# Patient Record
Sex: Male | Born: 1970 | Race: White | Hispanic: No | Marital: Married | Smoking: Current every day smoker
Health system: Southern US, Community
[De-identification: ages and names within clinical notes are randomized; demographics above are authoritative.]

## PROBLEM LIST (undated history)

## (undated) DIAGNOSIS — G629 Polyneuropathy, unspecified: Secondary | ICD-10-CM

## (undated) DIAGNOSIS — F1910 Other psychoactive substance abuse, uncomplicated: Secondary | ICD-10-CM

## (undated) DIAGNOSIS — F329 Major depressive disorder, single episode, unspecified: Secondary | ICD-10-CM

## (undated) DIAGNOSIS — F32A Depression, unspecified: Secondary | ICD-10-CM

## (undated) DIAGNOSIS — K219 Gastro-esophageal reflux disease without esophagitis: Secondary | ICD-10-CM

## (undated) DIAGNOSIS — F1011 Alcohol abuse, in remission: Secondary | ICD-10-CM

## (undated) DIAGNOSIS — E538 Deficiency of other specified B group vitamins: Secondary | ICD-10-CM

## (undated) DIAGNOSIS — F200 Paranoid schizophrenia: Secondary | ICD-10-CM

## (undated) HISTORY — PX: MULTIPLE TOOTH EXTRACTIONS: SHX2053

## (undated) HISTORY — PX: COLONOSCOPY: SHX174

---

## 2006-04-25 ENCOUNTER — Emergency Department (HOSPITAL_COMMUNITY): Admission: EM | Admit: 2006-04-25 | Discharge: 2006-04-26 | Payer: Self-pay | Admitting: Emergency Medicine

## 2006-04-26 ENCOUNTER — Emergency Department (HOSPITAL_COMMUNITY): Admission: EM | Admit: 2006-04-26 | Discharge: 2006-04-26 | Payer: Self-pay | Admitting: Emergency Medicine

## 2006-07-11 ENCOUNTER — Ambulatory Visit: Payer: Self-pay | Admitting: Internal Medicine

## 2006-07-17 DIAGNOSIS — E538 Deficiency of other specified B group vitamins: Secondary | ICD-10-CM | POA: Insufficient documentation

## 2006-09-20 ENCOUNTER — Ambulatory Visit: Payer: Self-pay | Admitting: Internal Medicine

## 2006-09-21 ENCOUNTER — Ambulatory Visit: Payer: Self-pay | Admitting: *Deleted

## 2006-10-10 DIAGNOSIS — F4323 Adjustment disorder with mixed anxiety and depressed mood: Secondary | ICD-10-CM | POA: Insufficient documentation

## 2006-10-10 DIAGNOSIS — G5730 Lesion of lateral popliteal nerve, unspecified lower limb: Secondary | ICD-10-CM | POA: Insufficient documentation

## 2006-10-10 DIAGNOSIS — F1010 Alcohol abuse, uncomplicated: Secondary | ICD-10-CM | POA: Insufficient documentation

## 2006-10-22 ENCOUNTER — Ambulatory Visit: Payer: Self-pay | Admitting: Internal Medicine

## 2006-11-21 ENCOUNTER — Ambulatory Visit: Payer: Self-pay | Admitting: Internal Medicine

## 2006-12-21 ENCOUNTER — Ambulatory Visit: Payer: Self-pay | Admitting: Internal Medicine

## 2006-12-21 DIAGNOSIS — M25569 Pain in unspecified knee: Secondary | ICD-10-CM | POA: Insufficient documentation

## 2006-12-22 ENCOUNTER — Encounter (INDEPENDENT_AMBULATORY_CARE_PROVIDER_SITE_OTHER): Payer: Self-pay | Admitting: Internal Medicine

## 2006-12-26 ENCOUNTER — Encounter (INDEPENDENT_AMBULATORY_CARE_PROVIDER_SITE_OTHER): Payer: Self-pay | Admitting: Internal Medicine

## 2006-12-26 LAB — CONVERTED CEMR LAB: Vitamin B-12: 163 pg/mL — ABNORMAL LOW (ref 211–911)

## 2007-01-08 ENCOUNTER — Ambulatory Visit (HOSPITAL_COMMUNITY): Admission: RE | Admit: 2007-01-08 | Discharge: 2007-01-08 | Payer: Self-pay | Admitting: Internal Medicine

## 2007-01-13 ENCOUNTER — Encounter (INDEPENDENT_AMBULATORY_CARE_PROVIDER_SITE_OTHER): Payer: Self-pay | Admitting: Internal Medicine

## 2007-01-30 ENCOUNTER — Ambulatory Visit: Payer: Self-pay | Admitting: Internal Medicine

## 2007-02-04 ENCOUNTER — Telehealth (INDEPENDENT_AMBULATORY_CARE_PROVIDER_SITE_OTHER): Payer: Self-pay | Admitting: Internal Medicine

## 2007-02-14 ENCOUNTER — Telehealth (INDEPENDENT_AMBULATORY_CARE_PROVIDER_SITE_OTHER): Payer: Self-pay | Admitting: Internal Medicine

## 2007-03-01 ENCOUNTER — Ambulatory Visit: Payer: Self-pay | Admitting: Internal Medicine

## 2007-03-07 ENCOUNTER — Ambulatory Visit: Payer: Self-pay | Admitting: Internal Medicine

## 2007-03-08 ENCOUNTER — Ambulatory Visit: Payer: Self-pay | Admitting: Internal Medicine

## 2007-03-12 LAB — CONVERTED CEMR LAB: Vitamin B-12: 621 pg/mL (ref 211–911)

## 2007-04-26 ENCOUNTER — Ambulatory Visit: Payer: Self-pay | Admitting: Nurse Practitioner

## 2007-04-26 DIAGNOSIS — H60399 Other infective otitis externa, unspecified ear: Secondary | ICD-10-CM | POA: Insufficient documentation

## 2007-05-27 ENCOUNTER — Ambulatory Visit: Payer: Self-pay | Admitting: Internal Medicine

## 2007-05-30 ENCOUNTER — Emergency Department (HOSPITAL_COMMUNITY): Admission: EM | Admit: 2007-05-30 | Discharge: 2007-05-30 | Payer: Self-pay | Admitting: Family Medicine

## 2007-05-30 ENCOUNTER — Telehealth (INDEPENDENT_AMBULATORY_CARE_PROVIDER_SITE_OTHER): Payer: Self-pay | Admitting: Internal Medicine

## 2007-06-06 ENCOUNTER — Encounter (INDEPENDENT_AMBULATORY_CARE_PROVIDER_SITE_OTHER): Payer: Self-pay | Admitting: *Deleted

## 2007-06-08 ENCOUNTER — Emergency Department (HOSPITAL_COMMUNITY): Admission: EM | Admit: 2007-06-08 | Discharge: 2007-06-08 | Payer: Self-pay | Admitting: Emergency Medicine

## 2007-06-26 ENCOUNTER — Ambulatory Visit: Payer: Self-pay | Admitting: Internal Medicine

## 2007-07-06 ENCOUNTER — Emergency Department (HOSPITAL_BASED_OUTPATIENT_CLINIC_OR_DEPARTMENT_OTHER): Admission: EM | Admit: 2007-07-06 | Discharge: 2007-07-07 | Payer: Self-pay | Admitting: Emergency Medicine

## 2007-07-09 ENCOUNTER — Encounter (INDEPENDENT_AMBULATORY_CARE_PROVIDER_SITE_OTHER): Payer: Self-pay | Admitting: *Deleted

## 2007-07-23 LAB — CONVERTED CEMR LAB: Vitamin B-12: >2000 pg/mL — ABNORMAL HIGH (ref 211–911)

## 2007-07-31 ENCOUNTER — Emergency Department (HOSPITAL_COMMUNITY): Admission: EM | Admit: 2007-07-31 | Discharge: 2007-07-31 | Payer: Self-pay | Admitting: Family Medicine

## 2007-08-01 ENCOUNTER — Ambulatory Visit: Payer: Self-pay | Admitting: Nurse Practitioner

## 2007-08-26 ENCOUNTER — Emergency Department (HOSPITAL_COMMUNITY): Admission: EM | Admit: 2007-08-26 | Discharge: 2007-08-26 | Payer: Self-pay | Admitting: Emergency Medicine

## 2007-09-02 ENCOUNTER — Ambulatory Visit: Payer: Self-pay | Admitting: Internal Medicine

## 2007-10-03 ENCOUNTER — Ambulatory Visit: Payer: Self-pay | Admitting: Nurse Practitioner

## 2007-11-04 ENCOUNTER — Ambulatory Visit: Payer: Self-pay | Admitting: Internal Medicine

## 2007-12-06 ENCOUNTER — Ambulatory Visit: Payer: Self-pay | Admitting: Internal Medicine

## 2008-01-06 ENCOUNTER — Ambulatory Visit: Payer: Self-pay | Admitting: Internal Medicine

## 2008-02-06 ENCOUNTER — Ambulatory Visit: Payer: Self-pay | Admitting: Internal Medicine

## 2008-03-09 ENCOUNTER — Ambulatory Visit: Payer: Self-pay | Admitting: Internal Medicine

## 2008-03-30 ENCOUNTER — Telehealth (INDEPENDENT_AMBULATORY_CARE_PROVIDER_SITE_OTHER): Payer: Self-pay | Admitting: Internal Medicine

## 2008-03-31 ENCOUNTER — Ambulatory Visit: Payer: Self-pay | Admitting: Internal Medicine

## 2008-04-21 ENCOUNTER — Telehealth (INDEPENDENT_AMBULATORY_CARE_PROVIDER_SITE_OTHER): Payer: Self-pay | Admitting: Internal Medicine

## 2008-04-23 ENCOUNTER — Ambulatory Visit: Payer: Self-pay | Admitting: Internal Medicine

## 2008-04-23 DIAGNOSIS — F200 Paranoid schizophrenia: Secondary | ICD-10-CM | POA: Insufficient documentation

## 2008-05-06 ENCOUNTER — Ambulatory Visit: Payer: Self-pay | Admitting: Internal Medicine

## 2008-06-12 ENCOUNTER — Ambulatory Visit: Payer: Self-pay | Admitting: Internal Medicine

## 2008-06-15 ENCOUNTER — Ambulatory Visit: Payer: Self-pay | Admitting: Nurse Practitioner

## 2008-06-15 ENCOUNTER — Encounter (INDEPENDENT_AMBULATORY_CARE_PROVIDER_SITE_OTHER): Payer: Self-pay | Admitting: Internal Medicine

## 2008-06-15 DIAGNOSIS — F17200 Nicotine dependence, unspecified, uncomplicated: Secondary | ICD-10-CM | POA: Insufficient documentation

## 2008-06-15 DIAGNOSIS — J329 Chronic sinusitis, unspecified: Secondary | ICD-10-CM | POA: Insufficient documentation

## 2008-07-09 LAB — CONVERTED CEMR LAB
Basophils Absolute: 0 10*3/uL (ref 0.0–0.1)
Basophils Relative: 0 % (ref 0–1)
Eosinophils Absolute: 0.4 10*3/uL (ref 0.0–0.7)
Eosinophils Relative: 5 % (ref 0–5)
HCT: 43.7 % (ref 39.0–52.0)
Hemoglobin: 14.8 g/dL (ref 13.0–17.0)
Lymphocytes Relative: 19 % (ref 12–46)
Lymphs Abs: 1.6 10*3/uL (ref 0.7–4.0)
MCHC: 33.9 g/dL (ref 30.0–36.0)
MCV: 96 fL (ref 78.0–100.0)
Monocytes Absolute: 0.8 10*3/uL (ref 0.1–1.0)
Monocytes Relative: 10 % (ref 3–12)
Neutro Abs: 5.4 10*3/uL (ref 1.7–7.7)
Neutrophils Relative %: 66 % (ref 43–77)
Platelets: 315 10*3/uL (ref 150–400)
RBC: 4.55 M/uL (ref 4.22–5.81)
RDW: 13.4 % (ref 11.5–15.5)
WBC: 8.2 10*3/uL (ref 4.0–10.5)

## 2009-01-15 ENCOUNTER — Encounter (INDEPENDENT_AMBULATORY_CARE_PROVIDER_SITE_OTHER): Payer: Self-pay | Admitting: Internal Medicine

## 2009-05-12 ENCOUNTER — Encounter (INDEPENDENT_AMBULATORY_CARE_PROVIDER_SITE_OTHER): Payer: Self-pay | Admitting: Internal Medicine

## 2009-05-12 ENCOUNTER — Ambulatory Visit: Payer: Self-pay | Admitting: Nurse Practitioner

## 2009-05-12 DIAGNOSIS — K029 Dental caries, unspecified: Secondary | ICD-10-CM | POA: Insufficient documentation

## 2009-05-12 LAB — CONVERTED CEMR LAB: Rapid HIV Screen: NEGATIVE

## 2009-05-14 LAB — CONVERTED CEMR LAB
HCT: 49.2 % (ref 39.0–52.0)
Hemoglobin: 16.4 g/dL (ref 13.0–17.0)
MCHC: 33.3 g/dL (ref 30.0–36.0)
MCV: 102.1 fL — ABNORMAL HIGH (ref 78.0–100.0)
Platelets: 327 10*3/uL (ref 150–400)
RBC: 4.82 M/uL (ref 4.22–5.81)
RDW: 13.1 % (ref 11.5–15.5)
Retic Ct Pct: 1.1 % (ref 0.4–3.1)
WBC: 6.6 10*3/uL (ref 4.0–10.5)

## 2009-05-18 ENCOUNTER — Encounter (INDEPENDENT_AMBULATORY_CARE_PROVIDER_SITE_OTHER): Payer: Self-pay | Admitting: Internal Medicine

## 2009-05-24 ENCOUNTER — Encounter (INDEPENDENT_AMBULATORY_CARE_PROVIDER_SITE_OTHER): Payer: Self-pay | Admitting: *Deleted

## 2009-07-15 ENCOUNTER — Ambulatory Visit: Payer: Self-pay | Admitting: Internal Medicine

## 2009-07-15 DIAGNOSIS — R1013 Epigastric pain: Secondary | ICD-10-CM | POA: Insufficient documentation

## 2009-07-15 DIAGNOSIS — R109 Unspecified abdominal pain: Secondary | ICD-10-CM | POA: Insufficient documentation

## 2009-07-15 LAB — CONVERTED CEMR LAB
ALT: 10 units/L (ref 0–53)
AST: 11 units/L (ref 0–37)
Albumin: 5 g/dL (ref 3.5–5.2)
Alkaline Phosphatase: 65 units/L (ref 39–117)
Amylase: 59 units/L (ref 0–105)
BUN: 13 mg/dL (ref 6–23)
Basophils Absolute: 0 10*3/uL (ref 0.0–0.1)
Basophils Relative: 0 % (ref 0–1)
CO2: 28 meq/L (ref 19–32)
Calcium: 10 mg/dL (ref 8.4–10.5)
Chlamydia, Swab/Urine, PCR: NEGATIVE
Chloride: 103 meq/L (ref 96–112)
Creatinine, Ser: 1.09 mg/dL (ref 0.40–1.50)
Eosinophils Absolute: 0.1 10*3/uL (ref 0.0–0.7)
Eosinophils Relative: 1 % (ref 0–5)
GC Probe Amp, Urine: NEGATIVE
Glucose, Bld: 92 mg/dL (ref 70–99)
HCT: 46.8 % (ref 39.0–52.0)
Hemoglobin: 15.6 g/dL (ref 13.0–17.0)
Lipase: 10 units/L (ref 0–75)
Lymphocytes Relative: 32 % (ref 12–46)
Lymphs Abs: 2.3 10*3/uL (ref 0.7–4.0)
MCHC: 33.3 g/dL (ref 30.0–36.0)
MCV: 101.3 fL — ABNORMAL HIGH (ref 78.0–100.0)
Monocytes Absolute: 0.8 10*3/uL (ref 0.1–1.0)
Monocytes Relative: 10 % (ref 3–12)
Neutro Abs: 4.1 10*3/uL (ref 1.7–7.7)
Neutrophils Relative %: 57 % (ref 43–77)
Platelets: 293 10*3/uL (ref 150–400)
Potassium: 4.5 meq/L (ref 3.5–5.3)
RBC: 4.62 M/uL (ref 4.22–5.81)
RDW: 13.6 % (ref 11.5–15.5)
Sodium: 144 meq/L (ref 135–145)
Total Bilirubin: 1.8 mg/dL — ABNORMAL HIGH (ref 0.3–1.2)
Total Protein: 7.7 g/dL (ref 6.0–8.3)
WBC: 7.3 10*3/uL (ref 4.0–10.5)

## 2009-07-16 ENCOUNTER — Encounter (INDEPENDENT_AMBULATORY_CARE_PROVIDER_SITE_OTHER): Payer: Self-pay | Admitting: Internal Medicine

## 2009-07-29 ENCOUNTER — Telehealth (INDEPENDENT_AMBULATORY_CARE_PROVIDER_SITE_OTHER): Payer: Self-pay | Admitting: Internal Medicine

## 2009-08-03 ENCOUNTER — Emergency Department (HOSPITAL_COMMUNITY): Admission: EM | Admit: 2009-08-03 | Discharge: 2009-08-03 | Payer: Self-pay | Admitting: Emergency Medicine

## 2009-08-06 ENCOUNTER — Encounter (INDEPENDENT_AMBULATORY_CARE_PROVIDER_SITE_OTHER): Payer: Self-pay | Admitting: Internal Medicine

## 2009-08-16 ENCOUNTER — Ambulatory Visit: Payer: Self-pay | Admitting: Internal Medicine

## 2009-08-20 ENCOUNTER — Other Ambulatory Visit: Payer: Self-pay

## 2009-08-21 ENCOUNTER — Inpatient Hospital Stay (HOSPITAL_COMMUNITY): Admission: RE | Admit: 2009-08-21 | Discharge: 2009-08-31 | Payer: Self-pay | Admitting: Psychiatry

## 2009-08-21 ENCOUNTER — Ambulatory Visit: Payer: Self-pay | Admitting: Psychiatry

## 2009-09-08 ENCOUNTER — Telehealth (INDEPENDENT_AMBULATORY_CARE_PROVIDER_SITE_OTHER): Payer: Self-pay | Admitting: Internal Medicine

## 2009-09-28 ENCOUNTER — Ambulatory Visit: Payer: Self-pay | Admitting: Internal Medicine

## 2009-09-28 DIAGNOSIS — R5381 Other malaise: Secondary | ICD-10-CM | POA: Insufficient documentation

## 2009-09-28 DIAGNOSIS — R5383 Other fatigue: Secondary | ICD-10-CM

## 2009-09-28 LAB — CONVERTED CEMR LAB: Testosterone: 768.61 ng/dL (ref 350–890)

## 2009-10-26 ENCOUNTER — Encounter (INDEPENDENT_AMBULATORY_CARE_PROVIDER_SITE_OTHER): Payer: Self-pay | Admitting: Internal Medicine

## 2009-11-01 ENCOUNTER — Ambulatory Visit: Payer: Self-pay | Admitting: Internal Medicine

## 2009-12-02 ENCOUNTER — Ambulatory Visit: Payer: Self-pay | Admitting: Internal Medicine

## 2010-01-03 ENCOUNTER — Ambulatory Visit: Payer: Self-pay | Admitting: Internal Medicine

## 2010-01-03 DIAGNOSIS — R05 Cough: Secondary | ICD-10-CM

## 2010-01-03 DIAGNOSIS — K219 Gastro-esophageal reflux disease without esophagitis: Secondary | ICD-10-CM | POA: Insufficient documentation

## 2010-01-03 DIAGNOSIS — R059 Cough, unspecified: Secondary | ICD-10-CM | POA: Insufficient documentation

## 2010-01-03 LAB — CONVERTED CEMR LAB: Vitamin B-12: >2000 pg/mL — ABNORMAL HIGH (ref 211–911)

## 2010-01-19 ENCOUNTER — Encounter (INDEPENDENT_AMBULATORY_CARE_PROVIDER_SITE_OTHER): Payer: Self-pay | Admitting: Internal Medicine

## 2010-02-03 ENCOUNTER — Ambulatory Visit
Admission: RE | Admit: 2010-02-03 | Discharge: 2010-02-03 | Payer: Self-pay | Source: Home / Self Care | Attending: Internal Medicine | Admitting: Internal Medicine

## 2010-03-01 NOTE — Miscellaneous (Signed)
Summary: AUTHORIZATION TO RELEASE INFO TO JENNIFER PREUSS  AUTHORIZATION TO RELEASE INFO TO JENNIFER PREUSS   Imported By: Arta Bruce 07/15/2009 12:22:05  _____________________________________________________________________  External Attachment:    Type:   Image     Comment:   External Document

## 2010-03-01 NOTE — Assessment & Plan Note (Signed)
Summary: b 12 injection/fasting labs also//gk  Nurse Visit     Allergies: No Known Drug Allergies     Medication Administration  Injection # 1:    Medication: Vit B12 1000 mcg    Diagnosis: VITAMIN B12 DEFICIENCY (ICD-266.2)    Route: IM    Site: L deltoid    Exp Date: 10/30/2009    Lot #: [id number]    Mfr: American Regent    Comments: [phone]    Patient tolerated injection without complications    Given by: Vesta Mixer CMA (May 06, 2008 11:31 AM)  Orders Added: 1)  Est. Patient Nurse visit [09003] 2)  Vit B12 1000 mcg [J3420] 3)  Admin of Therapeutic Inj  intramuscular or subcutaneous Lepidus.Putnam    ]  Medication Administration  Injection # 1:    Medication: Vit B12 1000 mcg    Diagnosis: VITAMIN B12 DEFICIENCY (ICD-266.2)    Route: IM    Site: L deltoid    Exp Date: 10/30/2009    Lot #: [id number]    Mfr: American Regent    Comments: [phone]    Patient tolerated injection without complications    Given by: Vesta Mixer CMA (May 06, 2008 11:31 AM)  Orders Added: 1)  Est. Patient Nurse visit [09003] 2)  Vit B12 1000 mcg [J3420] 3)  Admin of Therapeutic Inj  intramuscular or subcutaneous [84132]

## 2010-03-01 NOTE — Assessment & Plan Note (Signed)
Summary: kidney issues /tmm   Vital Signs:  Patient profile:   40 year old male Weight:      136 pounds Temp:     97.5 degrees F Pulse rate:   87 / minute Pulse rhythm:   regular Resp:     18 per minute BP sitting:   115 / 82  (left arm) Cuff size:   regular  Vitals Entered By: Vesta Mixer CMA (July 15, 2009 10:00 AM) CC: Sides hurting and c/o pressure when he urinates, leg hurting and wants dental referral Is Patient Diabetic? No  Does patient need assistance? Ambulation Normal   Primary Care Provider:  Ayansh Feutz  CC:  Sides hurting and c/o pressure when he urinates and leg hurting and wants dental referral.  History of Present Illness: 1.  Dental complaints:  pt. has been to dentist and waiting to be seen by oral surgeon to have extractions in near future.  Had some pain improvement with Amoxicillin previously, but had epigastric burning on Amoxicillin as well.  2.  B12 deficiency--nutritional, but difficulty getting level up even with IM injection of B12:  Has not been following up for shots.  Pt's MCV up again.  Did not get level at last visit.  3.  Pain in low thoracic back/flanks for past 2 weeks.  States was using Tylenol PM a lot.  Describes discomfort as a pressure pain, like someone pressing hard against back, also a burning discomfort that can radiate around to front of rib cage bilaterally.  Hurts worse with lying on back.  No injury to back recently.  No change with urination.  Burning may be better with eating.  Having a BM may make the burning a bit worse in rib area.  No hematochezia.  May have had melena--sounds like dark stools, may have a particularly bad odor, not sure if sticky.  No hematuria.  Does have urinary frequency and feels like he is unable to fully evacuate urine.  Also with urinary hesitancy.  Some suprapubic discomfort when needs to urinate.  May have some perineal discomfort.  Has had some testicular disomfort bilaterally in last few weeks.  No  swelling of testicles.  No penile discharge.  Allergies (verified): No Known Drug Allergies  Physical Exam  General:  NAD--very difficult historian.  Bradykinesia with slow response time Lungs:  Normal respiratory effort, chest expands symmetrically. Lungs are clear to auscultation, no crackles or wheezes. Heart:  Normal rate and regular rhythm. S1 and S2 normal without gallop, murmur, click, rub or other extra sounds. Abdomen:  No obvious discomfort with exam, but pt. does states epigastrium and suprapubic area a bit more uncomfortable with palpation.soft, normal bowel sounds, no masses, no guarding, no rigidity, no hepatomegaly, and no splenomegaly.  No definitie CV A tenderness. Rectal:  No external abnormalities noted. Normal sphincter tone. No rectal masses .  One small area of heme positivity on guaiac testing. Genitalia:  Testes bilaterally descended without nodularity, tenderness or masses. No scrotal masses or lesions. No penis lesions or urethral discharge.circumcised.   Prostate:  A bit enlarged, maybe a bit boggy.  Cannot get from pt. whether really tender or not.   Impression & Recommendations:  Problem # 1:  SUPRAPUBIC PAIN (ICD-789.09) Sounds like pt with chronic prostatis vs. BPH. Start with treatment of the former.  Orders: T-Culture, Urine ([id number]) T-Urinalysis ([id number]) T-Comprehensive Metabolic Panel (96295-28413) UA Dipstick w/o Micro (manual) (24401) T-GC Probe, urine ([id number]) T-Chlamydia  Probe, urine ([id number])  Problem # 2:  ABDOMINAL PAIN, EPIGASTRIC (ICD-789.06) Start Famotidine. Not clear what is causing this--do not believe at this point related to urinary tract Orders: T-CBC w/Diff [phone]) T-Amylase [phone]) T-Lipase (25427-06237) T-Comprehensive Metabolic Panel (62831-51761) UA Dipstick w/o Micro (manual) (60737)  Problem # 3:  DENTAL CARIES (ICD-521.00) To follow up with dentist  Problem # 4:  VITAMIN B12  DEFICIENCY (ICD-266.2) shot today and monthly Victorino Dike, girlfriend, to help keep him up to date with this.  Complete Medication List: 1)  Cyanocobalamin 1000 Mcg/ml Inj Soln (Cyanocobalamin) .Marland Kitchen.. 1000 mg im monthly 2)  Thiamine Hcl 100 Mg Tabs (Thiamine hcl) .Marland Kitchen.. 1 tab by mouth daily 3)  Wellbutrin Sr 150 Mg Xr12h-tab (Bupropion hcl) .... One tablet by mouth daily 4)  Ciprofloxacin Hcl 500 Mg Tabs (Ciprofloxacin hcl) .Marland Kitchen.. 1 tab by mouth two times a day for 30 days 5)  Famotidine 40 Mg Tabs (Famotidine) .Marland Kitchen.. 1 tab by mouth daily at bedtime  Patient Instructions: 1)  Follow up with Dr. Delrae Alfred in 2 months --urinary frequency 2)  Follow up with nurse visit in 1 month for B12 shot. 3)  Drink lots of fluids Prescriptions: FAMOTIDINE 40 MG TABS (FAMOTIDINE) 1 tab by mouth daily at bedtime  #30 x 4   Entered and Authorized by:   Julieanne Manson MD   Signed by:   Julieanne Manson MD on 07/15/2009   Method used:   Faxed to ...       [facility] (retail)       [address] [place], [place]  [address]       Ph: [phone]       Fax: [phone]   RxID:   [id number] CIPROFLOXACIN HCL 500 MG TABS (CIPROFLOXACIN HCL) 1 tab by mouth two times a day for 30 days  #60 x 0   Entered and Authorized by:   Julieanne Manson MD   Signed by:   Julieanne Manson MD on 07/15/2009   Method used:   Faxed to ...       [facility] (retail)       [address] [place], [place]  [address]       Ph: [phone]       Fax: [phone]   RxID:   [id number]   Appended Document: kidney issues /tmm  Laboratory Results   Urine Tests    Routine Urinalysis   Glucose: negative   (Normal Range: Negative) Bilirubin: small   (Normal Range: Negative) Ketone: trace (5)   (Normal Range: Negative) Spec. Gravity: 1.025   (Normal Range: 1.003-1.035) Blood: trace-intact   (Normal Range: Negative) pH:  6.0   (Normal Range: 5.0-8.0) Protein: 30   (Normal Range: Negative) Urobilinogen: 1.0   (Normal Range: 0-1) Nitrite: negative   (Normal Range: Negative) Leukocyte Esterace: negative   (Normal Range: Negative)         Medication Administration  Injection # 1:    Medication: Vit B12 1000 mcg    Diagnosis: VITAMIN B12 DEFICIENCY (ICD-266.2)    Route: IM    Site: R deltoid    Exp Date: 10/30/2010    Lot #: [id number]    Mfr: American Regent    Comments: [phone]    Patient tolerated injection without complications    Given by: Vesta Mixer CMA (July 15, 2009 12:02 PM)  Orders Added: 1)  Vit B12 1000 mcg [J3420] 2)  Admin of Therapeutic  Inj  intramuscular or subcutaneous [96372]   Appended Document: kidney issues /tmm Pt. admitted during visit that he has had difficulties with crack cocaine abuse in past 2 years.   He still drinks alcohol, but now that is no longer his drug of choice. His Haldol is on hold until he follows up with [facility]--secondary to his cocaine use.  He apparently has to show that he will stay off cocaine for 2 months before they will prescribe the Haldol again (as per pt. and girlfriend and mother of their newborn, Victorino Dike) He is also getting court orders to follow up with psych and is on probation for possession of drug paraphernalia.   Has not used cocaine since 4 days ago.   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

## 2010-03-01 NOTE — Assessment & Plan Note (Signed)
Summary: FU B-12/FEET AND LEGS HURTING///KT   Vital Signs:  Patient Profile:   40 Years Old Male Weight:      148 pounds Temp:     96.7 degrees F Pulse rate:   84 / minute Pulse rhythm:   regular Resp:     18 per minute BP sitting:   110 / 70  (left arm) Cuff size:   regular  Pt. in pain?   yes    Location:   feet/knees    Intensity:   6  Vitals Entered By: Vesta Mixer CMA (December 21, 2006 2:06 PM)              Is Patient Diabetic? No  Does patient need assistance? Ambulation Normal Comments out of vitamins     Chief Complaint:  feet and knees are still bothering him.  History of Present Illness: 1.  Vitamin B12 deficiency:  receiving B12 monthly IM to make sure pt. is remaining compliant--he has been coming in regularly for those since starting in June.  Unclear if B12 replacement has decreased peripheral neuropathic pain in feet and ankles.    2.  Alcoholism:  Still drinking--generally 3-6 beers daily.  Ran out of MV with 100 mg Thiamine.  3.  Bilateral knee pain--states this is chronic.  Will also have pain in feet as well.  Knee pain described as aching.  No hx of injury to knees.  Knees are occasionally swollen.  No definite erythema.  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  Has tried Ibuprofen 600 mg once daily with some relief.  4.  Depression:  Followed at [facility].  He thinks he is being followed by Dr. Hortencia Pilar.  Pt. also with psychosis and on Haldol and Cogentin--unable to say if diagnosed with Schizophrenia.  Taking Bupropion 100 mg daily  Current Allergies: No known allergies     Risk Factors:  Tobacco use:  current    Cigarettes:  Yes -- 2 pack(s) per day    Physical Exam  General:     Pt. very bradykinetic with movement and with speech.  Long delay in answering questions. Extremities:     No obvious effusions of either knee--full ROM no tenderness of joint lines or ligamentous laxity.  No  definite crepitation.  Pt. does have varicosities of left lower leg.  No edema of legs or ankles.    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 Try Neurontin as pt. seems to be bothered by discomfort at bedime. Follow up in 2 months.  Problem # 2:  KNEE PAIN, BILATERAL (ICD-719.46) Not clear about cause--possibly OA--though no changes on exam to support. Orders: Diagnostic X-Ray/Fluoroscopy (Diagnostic X-Ray/Flu)  His updated medication list for this problem includes:    Ibuprofen 600 Mg Tabs (Ibuprofen) .Marland Kitchen... 1 tab by mouth three times a day with  food.   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 1 tab by mouth daily   Complete Medication List: 1)  Cyanocobalamin 1000  Mcg/ml Inj Soln (Cyanocobalamin) .Marland Kitchen.. 1000 mg im monthly 2)  Haloperidol 1 Mg Tabs (Haloperidol) .Marland Kitchen.. 1-2 tabs by mouth at bedtime. 3)  Bupropion Hcl 100 Mg Tabs (Bupropion hcl) .Marland Kitchen.. 1 tab by mouth daily 4)  Benztropine Mesylate 1 Mg Tabs (Benztropine mesylate) .... 1/2 to 1 tab by mouth qhs 5)  Thiamine Hcl 100 Mg Tabs (Thiamine hcl) .Marland Kitchen.. 1 tab by mouth daily 6)  Neurontin 300 Mg Caps (Gabapentin) .Marland Kitchen.. 1 cap by mouth at bedtime for 3 nights, then 2 caps by mouth at bedtime. 7)  Ibuprofen 600 Mg Tabs (Ibuprofen) .Marland Kitchen.. 1 tab by mouth three times a day with  food.   Patient Instructions: 1)  Take Ibuprofen 1 tab 3 times daily with meals for knee pain 2)  The Neurontin is for the numbness and  tingling in legs. 3)  Get the xray of your knee as soon as possible at [facility] or [facility]. 4)  Call for follow up with Dr. Jiles Harold in 2 months. 5)  Stop drinking alcohol.    Prescriptions: IBUPROFEN 600 MG  TABS (IBUPROFEN) 1 tab by mouth three times a day with  food.  #90 x 1   Entered and Authorized by:   Julieanne Manson MD   Signed by:   Julieanne Manson MD on 12/21/2006   Method used:   Print then Give to Patient   RxID:   [id number] NEURONTIN 300 MG  CAPS (GABAPENTIN) 1 cap by mouth at bedtime for 3 nights, then 2 caps by mouth at bedtime.  #60 x 2   Entered and Authorized by:   Julieanne Manson MD   Signed by:   Julieanne Manson MD on 12/21/2006   Method used:   Print then Give to Patient   RxID:   [id number] THIAMINE HCL 100 MG  TABS (THIAMINE HCL) 1 tab by mouth daily  #30 x 11   Entered and Authorized by:   Julieanne Manson MD   Signed by:   Julieanne Manson MD on 12/21/2006   Method used:   Print then Give to Patient   RxID:   [id number]  ]  Medication Administration  Injection # 1:    Medication: Vit B12 1000 mcg    Diagnosis: VITAMIN B12 DEFICIENCY (ICD-266.2)    Route: IM    Site: R deltoid    Exp Date: 05/30/2008    Lot #: [id number]    Mfr: American Regent    Patient tolerated injection without complications    Given by: Vesta Mixer CMA (December 21, 2006 3:16 PM)  Orders Added: 1)  Diagnostic X-Ray/Fluoroscopy [Diagnostic X-Ray/Flu] 2)  T-Vitamin B12 [82607-23330] 3)  Est. Patient Level IV [01027] 4)  Admin of Therapeutic Inj  intramuscular or subcutaneous [90772] 5)  Vit B12 1000 mcg [J3420]

## 2010-03-01 NOTE — Assessment & Plan Note (Signed)
Summary: fu 2 months--urinary frequencyB-12//gk   Vital Signs:  Patient profile:   40 year old male Height:      70.5 inches (179.07 cm) Weight:      144.5 pounds (65.68 kg) Temp:     97.7 degrees F (36.50 degrees C) oral Pulse rate:   76 / minute Pulse rhythm:   regular Resp:     16 per minute BP sitting:   110 / 76  (left arm) Cuff size:   regular  Vitals Entered By: Michelle Nasuti (September 28, 2009 12:09 PM) CC: follow-up visit Pain Assessment Patient in pain? yes      Intensity: 4   Primary Care Provider:  Mulberry  CC:  follow-up visit.  History of Present Illness: 1.  Urinary frequency/prostatitis:  Grew E.coli resistant to Cipro.  Was treated with Macrobid two times a day for 30 days.  Urinary frequency is decreased,  no definite burning on urination.  Good urine stream and no hesitancy.  2.  Drug abuse:  pt. got into cocaine again.  On probation for drug use--going to [facility] 3 mornings each week.  To go in to [facility], a 30 day inpatient program on Thursday.   Girlfriend is getting support through [employer] has one on one interaction there to deal with above concerns.  3.  Schizophrenia:  pt. was almost catotonic end of July--this per girlfriend.  He was hospitalized for 1 week and placed back on Haldol.  Doing better now.  4.  Epigastric Pain: Not clear if has taken Famotidine regularly--no complaints of pain any longer.  5.  Fatigue:  girlfriend noted this previously.  Sex drive is not good as well.  She is concerned his testosterone level is low.  6.  Dental Clinic:  did have one tooth pulled--to have more pulled, but will have to put off until drug use concern addressed.  7.  Peripheral neuropathy:  Girlfriend shows me Mentax--folate--discussed she could use, but we did check folate in past and was okay.  Pt. would like to get back on Lyrica for his pain related to this.  Habits & Providers  Alcohol-Tobacco-Diet     Tobacco Status: current  Cigarette Packs/Day: 2.0  Allergies (verified): No Known Drug Allergies  Social History: Packs/Day:  2.0  Physical Exam  General:  Very bradykinetic, speech slow, yawns throughout exam Lungs:  Normal respiratory effort, chest expands symmetrically. Lungs are clear to auscultation, no crackles or wheezes. Heart:  Normal rate and regular rhythm. S1 and S2 normal without gallop, murmur, click, rub or other extra sounds. Abdomen:  Bowel sounds positive,abdomen soft and non-tender without masses, organomegaly or hernias noted.   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  Complete Medication List: 1)  Cyanocobalamin 1000 Mcg/ml Inj Soln (Cyanocobalamin) .Marland Kitchen.. 1000 mg im monthly 2)  Thiamine Hcl 100 Mg Tabs (Thiamine hcl) .Marland Kitchen.. 1 tab by mouth daily 3)  Famotidine 40 Mg Tabs (Famotidine) .Marland Kitchen.. 1 tab by mouth daily at bedtime 4)  Haloperidol 2 Mg Tabs (Haloperidol) .Marland Kitchen.. 1 tab by mouth at bedtime--[facility]--lindsey is nurse 5)  Benztropine Mesylate 1 Mg Tabs (Benztropine mesylate) .... 1/2 tab by mouth at bedtime.  [facility] 6)  Celexa 10 Mg Tabs (Citalopram hydrobromide) .Marland Kitchen.. 1 tab by mouth daily  [facility]. 7)  Lyrica 50 Mg Caps (Pregabalin) .Marland Kitchen.. 1 cap by mouth two times a day  Patient Instructions: 1)  B12 shot  monthly--needs nurse visit for this in 1 month 2)  Follow up with Dr. Delrae Alfred in 3 months --neuropathy Prescriptions: LYRICA 50 MG CAPS (PREGABALIN) 1 cap by mouth two times a day  #60 x 6   Entered and Authorized by:   Julieanne Manson MD   Signed by:   Julieanne Manson MD on 09/28/2009   Method used:   Print then Give to Patient   RxID:   [id number]

## 2010-03-01 NOTE — Letter (Signed)
Summary: MAILED REQUESTED RECORDS TO [facility]  MAILED REQUESTED RECORDS TO [facility]   Imported By: Arta Bruce 08/06/2009 12:29:07  _____________________________________________________________________  External Attachment:    Type:   Image     Comment:   External Document

## 2010-03-01 NOTE — Letter (Signed)
Summary: *HSN Results Follow up  [facility]  [address] [place], [place] [address]   Phone: [phone]  Fax: [phone]      06/06/2007   Todd Winters [address] [place], [place]  [address]   Dear  [name],                            ____S.Drinkard,FNP   ____D. Gore,FNP       ____B. McPherson,MD   ____V. Rankins,MD    __x__E. Mulberry,MD    ____N. Daphine Deutscher, FNP  ____D. Reche Dixon, MD    ____K. Philipp Deputy, MD    ____Other     This letter is to inform you that your recent test(s):  _______Pap Smear    _______Lab Test     _______X-ray    _______ is within acceptable limits  _______ requires a medication change  _______ requires a follow-up lab visit  _______ requires a follow-up visit with your Eaden Hettinger   Comments: We have attempted to call you several times, if you still need [place].      _________________________________________________________ If you have any questions, please contact our office                     Sincerely,  Vesta Mixer CMA [facility]

## 2010-03-01 NOTE — Letter (Signed)
Summary: *HSN Results Follow up  [facility]  [address] [place], [place] [address]   Phone: [phone]  Fax: [phone]      01/13/2007   ELAD MACPHAIL [address] [place], [place]  [address]   Dear  [name],                            ____S.Drinkard,FNP   ____D. Gore,FNP       ____B. McPherson,MD   ____V. Rankins,MD    __X__E. Mulberry,MD    ____N. Daphine Deutscher, FNP  ____D. Reche Dixon, MD    ____K. Philipp Deputy, MD    ____Other     This letter is to inform you that your recent test(s):  _______Pap Smear    ___X____Lab Test     ___X____X-ray  See in Comments.  _______ is within acceptable limits  _______ requires a medication change  _______ requires a follow-up lab visit  _______ requires a follow-up visit with your provider   Comments:  Your levels of B12 are still low--which is unusual since you've been getting shots.  I am talking with some other physicians about your case (no names mentioned) and will get back to you--it might be after the New Year, however.  Sorry this is taking a bit.  Also, your knee xrays were normal.  We'll see what the Neurontin does for you.       _________________________________________________________ If you have any questions, please contact our office                     Sincerely,  Julieanne Manson MD [facility]

## 2010-03-01 NOTE — Progress Notes (Signed)
Summary: refills request  Phone Note Call from Patient Call back at [phone]   Call For: [phone] Summary of Call: Pt needs to know if the proivider can refills his lyrica and  mentex.  [facility] [phone]) [facility] Initial call taken by: Manon Hilding,  July 29, 2009 3:57 PM  Follow-up for Phone Call        Fwd to Dr. Delrae Alfred for refill Berkley Harvey.  Pt uses [employer] [facility]. Follow-up by: Vesta Mixer CMA,  July 29, 2009 4:07 PM  Additional Follow-up for Phone Call Additional follow up Details #1::        Need more info--he was not taking Lyrica and we have not filled Mentex at last visit. Additional Follow-up by: Julieanne Manson MD,  July 30, 2009 6:17 PM    Additional Follow-up for Phone Call Additional follow up Details #2::    Left message on answering machine for pt to return call  Follow-up by: Vesta Mixer CMA,  August 11, 2009 4:50 PM  Additional Follow-up for Phone Call Additional follow up Details #3:: Details for Additional Follow-up Action Taken: pt says he took lyrica pt says its been a year since he recieved a script......Marland Kitchen pt says he is not sure why he stopped the lyrica but has been off of it for a month now.Marland KitchenMarland KitchenMarland KitchenArmenia Shannon  August 16, 2009 3:20 PM I do not follow--the last time the Rx for Roselee Nova was written was 03/2008 and he should have run out 6 months later.  If he is interested in restarting, needs to make an OV to discuss--was never clear that it helped him.  Also--what about the Mentex he was asking about as well?  Julieanne Manson MD  August 22, 2009 5:12 PM   Pt will come in on 8/16 to address.......... Tiffany McCoy CMA  August 25, 2009 11:05 AM

## 2010-03-01 NOTE — Medication Information (Signed)
Summary: PRIOR AUTHORIZATION /APPROVED  PRIOR AUTHORIZATION /APPROVED   Imported By: Arta Bruce 04/23/2008 10:24:41  _____________________________________________________________________  External Attachment:    Type:   Image     Comment:   External Document

## 2010-03-01 NOTE — Progress Notes (Signed)
Summary: refill request  Phone Note Call from Patient Call back at Home Phone [phone]   Caller: Patient Call For: [phone] Summary of Call: The pt states that he needs more medical refills from Lyrica and Neuratin medications.  [facility] [address] Dr Delrae Alfred Initial call taken by: Manon Hilding,  March 30, 2008 2:28 PM  Follow-up for Phone Call        note pt changing pharmacy I see lyrica on list, but not nuerontin. Follow-up by: Vesta Mixer CMA,  March 31, 2008 11:07 AM      Prescriptions: LYRICA 50 MG CAPS (PREGABALIN) 1 cap by mouth two times a day  #60 x 6   Entered and Authorized by:   Julieanne Manson MD   Signed by:   Julieanne Manson MD on 03/31/2008   Method used:   Printed then faxed to ...       [facility] (retail)       [address] [place], [place]  [address]       Ph: [phone]       Fax: [phone]   RxID:   [id number]  [facility] [address]

## 2010-03-01 NOTE — Progress Notes (Signed)
Summary: Lyrica Prescription  Phone Note Call from Patient Call back at Home Phone [phone] Call back at [phone]   Caller: Patient Summary of Call: This pt recently has medicaid but he doesn't know if needs to receive an authorization from the [facility] or either from medicaid to get his lyrica.  The medication cost him $180.00 and he cannot afford to pay the medication.  [facility] is waiting for an authorization  Dr Delrae Alfred  Initial call taken by: Manon Hilding,  April 21, 2008 12:45 PM  Follow-up for Phone Call        PA form filled out and in your office. Follow-up by: Vesta Mixer CMA,  April 22, 2008 9:21 AM  Additional Follow-up for Phone Call Additional follow up Details #1::        Already approved. Additional Follow-up by: Julieanne Manson MD,  April 22, 2008 5:51 PM

## 2010-03-01 NOTE — Assessment & Plan Note (Signed)
Summary: B-12///KT  Nurse Visit    Prior Medications: CYANOCOBALAMIN 1000 MCG/ML INJ SOLN (CYANOCOBALAMIN) 1000 mg IM monthly HALOPERIDOL 1 MG  TABS (HALOPERIDOL) 1-2 tabs by mouth at bedtime. BUPROPION HCL 100 MG  TABS (BUPROPION HCL) 1 tab by mouth daily BENZTROPINE MESYLATE 1 MG  TABS (BENZTROPINE MESYLATE) 1/2 to 1 tab by mouth qhs THIAMINE HCL 100 MG  TABS (THIAMINE HCL) 1 tab by mouth daily NEURONTIN 300 MG  CAPS (GABAPENTIN) 1 cap by mouth at bedtime for 3 nights, then 2 caps by mouth at bedtime. IBUPROFEN 600 MG  TABS (IBUPROFEN) 1 tab by mouth three times a day with  food. Current Allergies: No known allergies     Medication Administration  Injection # 1:    Medication: Vit B12 1000 mcg    Diagnosis: VITAMIN B12 DEFICIENCY (ICD-266.2)    Route: IM    Site: L deltoid    Exp Date: 03/30/2009    Lot #: [id number]    Mfr: American Regent    Comments: [phone]    Patient tolerated injection without complications    Given by: Vesta Mixer CMA (November 04, 2007 4:05 PM)  Orders Added: 1)  Est. Patient Nurse visit [09003] 2)  Vit B12 1000 mcg [J3420] 3)  Admin of Therapeutic Inj  intramuscular or subcutaneous Lepidus.Putnam    ]

## 2010-03-01 NOTE — Progress Notes (Signed)
Summary: ov  Phone Note Call from Patient   Summary of Call: MULBERRY PT. HAVING HEADACHES AND FEELING REAL WEAK SINCE CHRISTMAS/ AND LOWER BACK PAIN ON RIGHT AND LEFT SIDE. AND HE WANTS TO BE CHECKED FOR STD BECAUSE HE HAS SOME RED SPOTS IN HIS PRIVATE AREA. Initial call taken by: Leodis Rains,  February 04, 2007 4:01 PM  Follow-up for Phone Call        see if pt can come in this thur at 8:45 with mulberry Follow-up by: Vesta Mixer CMA,  February 04, 2007 4:20 PM  Additional Follow-up for Phone Call Additional follow up Details #1::        spoke with    Additional Follow-up for Phone Call Additional follow up Details #2::    IS SCHEDULED TO COME IN ON THURSDAY 01/08 TO SEE MULBERRY Follow-up by: Leodis Rains,  February 05, 2007 9:13 AM

## 2010-03-01 NOTE — Assessment & Plan Note (Signed)
Summary: B12 PER NURSE / NS  Nurse Visit    Prior Medications: CYANOCOBALAMIN 1000 MCG/ML INJ SOLN (CYANOCOBALAMIN) 1000 mg IM monthly HALOPERIDOL 1 MG  TABS (HALOPERIDOL) 1-2 tabs by mouth at bedtime. BUPROPION HCL 100 MG  TABS (BUPROPION HCL) 1 tab by mouth daily BENZTROPINE MESYLATE 1 MG  TABS (BENZTROPINE MESYLATE) 1/2 to 1 tab by mouth qhs THIAMINE HCL 100 MG  TABS (THIAMINE HCL) 1 tab by mouth daily NEURONTIN 300 MG  CAPS (GABAPENTIN) 1 cap by mouth at bedtime for 3 nights, then 2 caps by mouth at bedtime. IBUPROFEN 600 MG  TABS (IBUPROFEN) 1 tab by mouth three times a day with  food. Current Allergies: No known allergies     Medication Administration  Injection # 1:    Medication: Vit B12 1000 mcg    Diagnosis: VITAMIN B12 DEFICIENCY (ICD-266.2)    Route: IM    Site: R deltoid    Lot #: [id number]    Mfr: American Regent    Patient tolerated injection without complications    Given by: Vesta Mixer CMA (October 14, 2007 10:18 AM)  Orders Added: 1)  Est. Patient Nurse visit [09003] 2)  Vit B12 1000 mcg [J3420] 3)  Admin of Therapeutic Inj  intramuscular or subcutaneous Lepidus.Putnam    ]

## 2010-03-01 NOTE — Assessment & Plan Note (Signed)
Summary: per Dr. Delrae Alfred B12 injection//gk  Nurse Visit    Prior Medications: CYANOCOBALAMIN 1000 MCG/ML INJ SOLN (CYANOCOBALAMIN) 1000 mg IM monthly HALOPERIDOL 1 MG  TABS (HALOPERIDOL) 1-2 tabs by mouth at bedtime. BUPROPION HCL 100 MG  TABS (BUPROPION HCL) 1 tab by mouth daily BENZTROPINE MESYLATE 1 MG  TABS (BENZTROPINE MESYLATE) 1/2 to 1 tab by mouth qhs THIAMINE HCL 100 MG  TABS (THIAMINE HCL) 1 tab by mouth daily NEURONTIN 300 MG  CAPS (GABAPENTIN) 1 cap by mouth at bedtime for 3 nights, then 2 caps by mouth at bedtime. IBUPROFEN 600 MG  TABS (IBUPROFEN) 1 tab by mouth three times a day with  food. Current Allergies: No known allergies     Medication Administration  Injection # 1:    Medication: Vit B12 1000 mcg    Diagnosis: VITAMIN B12 DEFICIENCY (ICD-266.2)    Route: IM    Site: L deltoid    Exp Date: 05/2008    Lot #: [id number]    Mfr: American Regent    Patient tolerated injection without complications    Given by: Verdis Prime (March 07, 2007 2:42 PM)  Orders Added: 1)  Vit B12 1000 mcg [J3420] 2)  Admin of Therapeutic Inj  intramuscular or subcutaneous Lepidus.Putnam    ]

## 2010-03-01 NOTE — Letter (Signed)
Summary: *HSN Results Follow up  [facility]  [address] [place], [place] [address]   Phone: [phone]  Fax: [phone]      10/26/2009   [facility] [address] [place], [place]  [address]   Dear  [name],                            ____S.Drinkard,FNP   ____D. Gore,FNP       ____B. McPherson,MD   ____V. Rankins,MD    _X___E. Mulberry,MD    ____N. Daphine Deutscher, FNP  ____D. Reche Dixon, MD    ____K. Philipp Deputy, MD    ____Other     This letter is to inform you that your recent test(s):  _______Pap Smear    ____X___Lab Test     _______X-ray    ___X____ is within acceptable limits  _______ requires a medication change  _______ requires a follow-up lab visit  _______ requires a follow-up visit with your Yeraldin Litzenberger   Comments:  Testosterone level was normal.       _________________________________________________________ If you have any questions, please contact our office                     Sincerely,  Julieanne Manson MD [facility]

## 2010-03-01 NOTE — Progress Notes (Signed)
Summary: Dr. Darin Engels phone consult  Phone Note Outgoing Call Call back at hematology   Call placed by: Julieanne Manson MD,  February 14, 2007 10:34 AM Summary of Call: Spoke with Drs. Sherrill and Granfortuna re:  Pt's continued low B12 level and elevated MMA despite IM injections of B12 since June.  They are also uncertain of why his level would be low.  Dr. Cyndie Chime recommended checking a B12 level the day following an injection along with a homocysteine level.  The former, to see if the B12 is being absorbed, the latter to lead toward work up of MHTFR gene abnormality.  Pt. to continue with B12 injections monthly.  Please set pt. up for this with next monthly injection.  Has he come in the past 2 months for the injection? Initial call taken by: Julieanne Manson MD,  February 14, 2007 10:37 AM  Follow-up for Phone Call        pt came in on 11/21 and 12/31 for vit b12 Follow-up by: Vesta Mixer CMA,  February 15, 2007 4:54 PM  Additional Follow-up for Phone Call Additional follow up Details #1::        Thanks for info--just needs set up for this month and labs as noted. Additional Follow-up by: Julieanne Manson MD,  February 17, 2007 12:43 PM    Additional Follow-up for Phone Call Additional follow up Details #2::    pt scheduled for 03/01/07 Follow-up by: Vesta Mixer CMA,  February 21, 2007 11:23 AM

## 2010-03-01 NOTE — Assessment & Plan Note (Signed)
Summary: 3 MONTH F/U/B-12///BC   Vital Signs:  Patient profile:   40 year old male Weight:      159.31 pounds BMI:     22.62 Temp:     97.5 degrees F oral Pulse rate:   110 / minute Pulse rhythm:   regular Resp:     26 per minute BP sitting:   122 / 78  (left arm) Cuff size:   regular  Vitals Entered By: Hale Drone CMA (January 03, 2010 11:18 AM) CC: 3 month f/u on neuropathy, B-12. Would like to change his acid reflux med. (femotidne). Couging up gray fleghm x3/4 months. Ankles feel numb and knees are aching.  Is Patient Diabetic? No Pain Assessment Patient in pain? no       Does patient need assistance? Functional Status Self care Ambulation Normal   Primary Care Provider:  Mulberry  CC:  3 month f/u on neuropathy and B-12. Would like to change his acid reflux med. (femotidne). Couging up gray fleghm x3/4 months. Ankles feel numb and knees are aching. Marland Kitchen  History of Present Illness: 1.  Peripheral Neuropathy:  Lyrica helping.  Only taking once daily as it makes him sleepy.  Having difficulties waking up in morning as he is taking Haldol and Lyrica at bedtime.  Gets sleepy about 30 minutes after taking Lyrica.  Does not miss evening dose frequently.  2.  Schizophrenia:  Celexa switched to Wellbutin.  Was not using Benztropine and no signs or symptoms of TD, so discontinued.  Doing well since on Haldol.    3.  B12 deficiency:  has been coming in fairly regularly each month for shots.    4.  Coughing since before last visit in August.  Smoking 2 ppd.  Cough worse right after smoking.    5.  GERD:  Famotidine 40 mg daily.Smoking.  Drinking lots of caffeinated soda daily--at least 6 if not many more than that.  Current Medications (verified): 1)  Cyanocobalamin 1000 Mcg/ml Inj Soln (Cyanocobalamin) .Marland Kitchen.. 1000 Mg Im Monthly 2)  Thiamine Hcl 100 Mg  Tabs (Thiamine Hcl) .Marland Kitchen.. 1 Tab By Mouth Daily 3)  Famotidine 40 Mg Tabs (Famotidine) .Marland Kitchen.. 1 Tab By Mouth Daily At Bedtime 4)   Haloperidol 2 Mg Tabs (Haloperidol) .Marland Kitchen.. 1 Tab By Mouth At Bedtime--[facility]--Lindsey Is Nurse 5)  Benztropine Mesylate 1 Mg Tabs (Benztropine Mesylate) .... 1/2 Tab By Mouth At Bedtime.  [facility] 6)  Celexa 10 Mg Tabs (Citalopram Hydrobromide) .Marland Kitchen.. 1 Tab By Mouth Daily  [facility]. 7)  Lyrica 50 Mg Caps (Pregabalin) .Marland Kitchen.. 1 Cap By Mouth Two Times A Day 8)  Wellbutrin Xl 150 Mg Xr24h-Tab (Bupropion Hcl) .... Take 2 Tablets By Mouth Daily in The A.m.  Allergies (verified): No Known Drug Allergies  Physical Exam  General:  NAD--no cough during visit Ears:  External ear exam shows no significant lesions or deformities.  Otoscopic examination reveals clear canals, tympanic membranes are intact bilaterally without bulging, retraction, inflammation or discharge. Hearing is grossly normal bilaterally. Mouth:  pharynx pink and moist.  Poor dentition Neck:  No deformities, masses, or tenderness noted. Lungs:  Normal respiratory effort, chest expands symmetrically. Lungs are clear to auscultation, no crackles or wheezes. Heart:  Normal rate and regular rhythm. S1 and S2 normal without gallop, murmur, click, rub or other extra sounds.   Impression & Recommendations:  Problem # 1:  GERD (ICD-530.81) Increase to two times a day  Stop caffeine, tobacco, alcohol, elevate HOB--all discussed  His updated medication list for this problem includes:    Famotidine 40 Mg Tabs (Famotidine) .Marland Kitchen... 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  Problem # 4:  COUGH (ICD-786.2) Encouraged smoking cessation with nicotine patches.  Complete Medication  List: 1)  Cyanocobalamin 1000 Mcg/ml Inj Soln (Cyanocobalamin) .Marland Kitchen.. 1000 mg im monthly 2)  Thiamine Hcl 100 Mg Tabs (Thiamine hcl) .Marland Kitchen.. 1 tab by mouth daily 3)  Famotidine 40 Mg Tabs (Famotidine) .Marland Kitchen.. 1 tab by mouth two times a day 1/2 hour before meals 4)  Haloperidol 2 Mg Tabs (Haloperidol) .Marland Kitchen.. 1 tab by mouth at bedtime--[facility]--lindsey is nurse 5)  Benztropine Mesylate 1 Mg Tabs (Benztropine mesylate) .... 1/2 tab by mouth at bedtime.  [facility] 6)  Lyrica 50 Mg Caps (Pregabalin) .Marland Kitchen.. 1 cap by mouth two times a day 7)  Wellbutrin Xl 150 Mg Xr24h-tab (Bupropion hcl) .... Take 2 tablets by mouth daily in the a.m.  Other Orders: Flu Vaccine 8yrs + (78469) Admin 1st Vaccine (62952)  Patient Instructions: 1)  Follow up with Dr. Delrae Alfred in 4 months 2)  NUrse visit for B12 shot in 1 month Prescriptions: FAMOTIDINE 40 MG TABS (FAMOTIDINE) 1 tab by mouth two times a day 1/2 hour before meals  #60 x 11   Entered and Authorized by:   Julieanne Manson MD   Signed by:   Julieanne Manson MD on 01/03/2010   Method used:   Electronically to        [facility]. [phone]* (retail)       [address]       [place], [place]  [address]       Ph: [phone]       Fax: [phone]   RxID:   [id number]    Medication Administration  Injection # 1:    Medication: Vit B12 1000 mcg    Diagnosis: VITAMIN B12 DEFICIENCY (ICD-266.2)    Route: IM    Site: R deltoid    Exp Date: 07/30/2011    Lot #: [id number]    Mfr: American Regent    Comments: NDC: [phone]    Patient tolerated injection without complications    Given by: Hale Drone CMA (January 03, 2010 11:47 AM)  Orders Added: 1)  Flu Vaccine 45yrs + [93235] 2)  Admin 1st Vaccine [57322] 3)  Admin of Therapeutic Inj  intramuscular or subcutaneous [96372] 4)  Vit B12 1000 mcg [J3420] 5)  T-Vitamin B12 [82607-23330] 6)  Est. Patient Level IV [02542]   Immunizations  Administered:  Influenza Vaccine # 1:    Vaccine Type: Fluvax 3+    Site: left deltoid    Mfr: GlaxoSmithKline    Dose: 0.5 ml    Route: IM    Given by: Hale Drone CMA    Exp. Date: 07/30/2010    Lot #: [id number]    VIS given: 08/24/09 version given January 03, 2010.  Flu Vaccine Consent Questions:    Do you have a history of severe allergic reactions to this vaccine? no    Any prior history of allergic reactions to egg  and/or gelatin? no    Do you have a sensitivity to the preservative Thimersol? no    Do you have a past history of Guillan-Barre Syndrome? no    Do you currently have an acute febrile illness? no    Have you ever had a severe reaction to latex? no    Vaccine information given and explained to patient? yes   Immunizations Administered:  Influenza Vaccine # 1:    Vaccine Type: Fluvax 3+    Site: left deltoid    Mfr: GlaxoSmithKline    Dose: 0.5 ml    Route: IM    Given by: Hale Drone CMA    Exp. Date: 07/30/2010    Lot #: [id number]    VIS given: 08/24/09 version given January 03, 2010.

## 2010-03-01 NOTE — Letter (Signed)
Summary: DENTAL REFERRAL  DENTAL REFERRAL   Imported By: Arta Bruce 05/19/2009 09:28:29  _____________________________________________________________________  External Attachment:    Type:   Image     Comment:   External Document

## 2010-03-01 NOTE — Progress Notes (Signed)
  Phone Note Call from Patient   Caller: Patient Reason for Call: Acute Illness Summary of Call: Pt has a Sinus Infection & sore throat and he wants Dr Delrae Alfred to prescribe someting or give him an appoinment . Please, call him at [phone]. Initial call taken by: Cheryll Dessert,  May 30, 2007 2:13 PM  Follow-up for Phone Call        [phone] mailbox is full and can't accept new messages Follow-up by: Vesta Mixer CMA,  May 31, 2007 1:25 PM  Additional Follow-up for Phone Call Additional follow up Details #1::        If able to get hold of him--he needs to be evaluated in office for treatment.   Additional Follow-up by: Julieanne Manson MD,  Jun 02, 2007 5:13 PM    Additional Follow-up for Phone Call Additional follow up Details #2::    Left message on answering machine for pt to return call  Follow-up by: Vesta Mixer CMA,  Jun 04, 2007 9:10 AM  Additional Follow-up for Phone Call Additional follow up Details #3:: Details for Additional Follow-up Action Taken: Left message on answering machine for pt to return call will mail letter to pt to call if he stil needs [place]. Additional Follow-up by: Vesta Mixer CMA,  Jun 06, 2007 2:36 PM

## 2010-03-01 NOTE — Assessment & Plan Note (Signed)
Summary: OV AND B-12////KT   Vital Signs:  Patient Profile:   40 Years Old Male Height:     70.5 inches Weight:      142.5 pounds BMI:     20.23 Temp:     98.2 degrees F Pulse rate:   80 / minute Pulse rhythm:   regular Resp:     18 per minute BP sitting:   124 / 76  (left arm) Cuff size:   regular  Pt. in pain?   no  Vitals Entered By: Vesta Mixer CMA (December 06, 2007 3:05 PM)                  PCP:  Delrae Alfred  Chief Complaint:  b12 shot and f/u with pcp.  History of Present Illness: 1.  Peripheral neuropathy:  Felt secondary to B12 deficiency.  Pt. no longer deficient, but continues to have some symptoms bilaterally in pretibial area.  No numbness definitively in feet.  Did not tolerate Neurontin--made him too sleepy.  2.  Alcoholism:  Still drinking.  Trying to keep it down to 2-4 beers daily.  Not really motivated to quit--many excuses.  3.  ?Schizophrenia:  Definitely has paranoia, but no definite visual hallucinations or auditory hallucinations.  Not taking Zyprexa on a daily basis--states he gets dizzy and tired at times and will stop and start.  Has missed about 7 days out of last month.  Continues with Dr. Hortencia Pilar at [facility].    Current Allergies: No known allergies       Physical Exam  Lungs:     Normal respiratory effort, chest expands symmetrically. Lungs are clear to auscultation, no crackles or wheezes. Heart:     Normal rate and regular rhythm. S1 and S2 normal without gallop, murmur, click, rub or other extra sounds. Extremities:     No clubbing, cyanosis, edema, or deformity noted with normal full range of motion of all joints.      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 Hortencia Pilar. Encouraged pt. to take his antipsychotic regularly  Complete Medication List: 1)  Cyanocobalamin 1000 Mcg/ml Inj Soln (Cyanocobalamin) .Marland Kitchen.. 1000 mg im monthly 2)  Zyprexa 7.5 Mg Tabs (Olanzapine) .Marland Kitchen.. 1 by mouth at bedtime 3)  Thiamine Hcl 100 Mg Tabs (Thiamine hcl) .Marland Kitchen.. 1 tab by mouth daily 4)  Lyrica 50 Mg Caps (Pregabalin) .Marland Kitchen.. 1 cap by mouth two times a day   Patient Instructions: 1)  CPE with Dr. Delrae Alfred next available--also to follow up on start of Lyrica   Prescriptions: THIAMINE HCL 100 MG  TABS (THIAMINE HCL) 1 tab by mouth daily  #30 x 11   Entered and Authorized by:   Julieanne Manson MD   Signed by:   Julieanne Manson MD on 12/06/2007   Method used:   Print then Give to Patient   RxID:   [id number] LYRICA 50 MG CAPS (PREGABALIN) 1 cap by mouth two times a day  #60 x 4   Entered and Authorized by:   Julieanne Manson MD   Signed by:   Julieanne Manson MD on 12/06/2007   Method used:   Print then Give to  Patient   RxID:   [id number]  ]  Medication Administration  Injection # 1:    Medication: Vit B12 1000 mcg    Diagnosis: VITAMIN B12 DEFICIENCY (ICD-266.2)    Route: IM    Site: R deltoid    Exp Date: 07/30/2009    Lot #: [id number]    Mfr: American Regent    Comments: [phone]    Patient tolerated injection without complications    Given by: Vesta Mixer CMA (December 06, 2007 3:41 PM)  Orders Added: 1)  Est. Patient Level III [46962] 2)  Vit B12 1000 mcg [J3420] 3)  Admin of Therapeutic Inj  intramuscular or subcutaneous [95284]
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
## 2010-03-01 NOTE — Assessment & Plan Note (Signed)
Summary: DISCUSS HIS B12//GET B-12////KT   Vital Signs:  Patient Profile:   40 Years Old Male Height:     70.5 inches Weight:      152 pounds BMI:     21.58 Temp:     97.7 degrees F Pulse rate:   84 / minute Pulse rhythm:   regular Resp:     18 per minute BP sitting:   118 / 72  (left arm) Cuff size:   regular  Pt. in pain?   yes    Location:   feet/knees    Intensity:   7  Vitals Entered By: Vesta Mixer CMA (March 01, 2007 2:37 PM)              Is Patient Diabetic? No  Does patient need assistance? Ambulation Normal Comments per pt on all meds except has not started neurontin yet     Chief Complaint:  B12 injection and f/u, legs hurting him alot mostly at night, and has been real tired lately drinking alot of caffiene  to stay awake.Marland Kitchen  History of Present Illness: 1.  B12 deficiency:  Discussed coming in day after next injection--unfortunately here on a Friday for shot.  Still drinking 3-6 beers a day.  Describes a poor diet.  2.  Peripheral neuropathy:  Has not started Neurontin--does have the pills.  Have not been able to normalize B12 to see if improves yet.  Current Allergies: No known allergies         Impression & Recommendations:  Problem # 1:  VITAMIN B12 DEFICIENCY (ICD-266.2) Problem with scheduling--will have pt. come back next week to get shot and B12/homocysteine level on following day.  No charge for today  Complete Medication List: 1)  Cyanocobalamin 1000 Mcg/ml Inj Soln (Cyanocobalamin) .Marland Kitchen.. 1000 mg im monthly 2)  Haloperidol 1 Mg Tabs (Haloperidol) .Marland Kitchen.. 1-2 tabs by mouth at bedtime. 3)  Bupropion Hcl 100 Mg Tabs (Bupropion hcl) .Marland Kitchen.. 1 tab by mouth daily 4)  Benztropine Mesylate 1 Mg Tabs (Benztropine mesylate) .... 1/2 to 1 tab by mouth qhs 5)  Thiamine Hcl 100 Mg Tabs (Thiamine hcl) .Marland Kitchen.. 1 tab by mouth daily 6)  Neurontin 300 Mg Caps (Gabapentin) .Marland Kitchen.. 1 cap by mouth at bedtime for 3 nights, then 2 caps by mouth at bedtime. 7)   Ibuprofen 600 Mg Tabs (Ibuprofen) .Marland Kitchen.. 1 tab by mouth three times a day with  food.   Patient Instructions: 1)  Needs to be scheduled for B12 shot next week and return the next day for Vit B12 level and homocysteine level.    ]

## 2010-03-01 NOTE — Progress Notes (Signed)
Summary: Side Effects from antibiotic  Phone Note Call from Patient Call back at [phone]   Summary of Call: The  pt is wondered if the [name] can change the antibiotic that he is using for his teeth; although the medication helped him is causing some side effeccts as diarrhea.  Hans Eden) Delrae Alfred MD  Initial call taken by: Manon Hilding,  September 08, 2009 8:38 AM  Follow-up for Phone Call        Left message on answering machine for pt. to return call.  Dutch Quint RN  September 08, 2009 8:51 AM  Having diarrhea only -- thinks it's the medication, been going on for about 4-5 days.  Currently taking Cipro.  Dutch Quint RN  September 08, 2009 9:03 AM   Additional Follow-up for Phone Call Additional follow up Details #1::        Who prescribed cipro? I only see Macrobid in chart and that was given in June.  Should be done with that Rx by now.  Additional Follow-up by: Tereso Newcomer PA-C,  September 08, 2009 2:09 PM    Additional Follow-up for Phone Call Additional follow up Details #2::    Dr. Delrae Alfred gave him cipro on 6/16 for a utn, but changed to macrobid.  He started back taking the cipro for dental infection since he still had it.  He called the dental clinic and is scheduled to have tooth pulled next week, but says they told him to call here for medication. Follow-up by: Vesta Mixer CMA,  September 09, 2009 11:18 AM  Additional Follow-up for Phone Call Additional follow up Details #3:: Details for Additional Follow-up Action Taken: He can stop the cipro.  Not exactly a med to cover for dental infection. He would need to be seen to decide if he really needs antibiotics for his teeth. Tereso Newcomer PA-C  September 09, 2009 12:20 PM  Pt scheduled to come see Dr. Delrae Alfred per Ashby Dawes. Additional Follow-up by: Vesta Mixer CMA,  September 09, 2009 4:32 PM

## 2010-03-01 NOTE — Letter (Signed)
Summary: *HSN Results Follow up  [facility]  [address] [place], [place] [address]   Phone: [phone]  Fax: [phone]      07/09/2007   Todd Winters [address] [place], [place]  [address]   Dear  [name],                            ____S.Drinkard,FNP   ____D. Gore,FNP       ____B. McPherson,MD   ____V. Rankins,MD    __x__E. Mulberry,MD    ____N. Daphine Deutscher, FNP  ____D. Reche Dixon, MD    ____K. Philipp Deputy, MD    ____Other     This letter is to inform you that your recent test(s):  _______Pap Smear    ___x____Lab Test     _______X-ray    _______ is within acceptable limits  _______ requires a medication change  _______ requires a follow-up lab visit  _______ requires a follow-up visit with your provider   Comments: Please call us regarding your recent lab test.     _________________________________________________________ If you have any questions, please contact our office                     Sincerely,  Tiffany McCoy CMA [facility]

## 2010-03-01 NOTE — Assessment & Plan Note (Signed)
Summary: Acute - Dental problems   Vital Signs:  Patient profile:   40 year old male Height:      70.5 inches Weight:      132 pounds BMI:     18.74 Temp:     97.9 degrees F oral Pulse rate:   77 / minute Pulse rhythm:   regular Resp:     18 per minute BP sitting:   125 / 83  (left arm) Cuff size:   regular  Vitals Entered By: [place] Shannon (May 12, 2009 3:45 PM) CC: pt would like a referral to go to denist... pt would like to get b12 shot....., Depression Is Patient Diabetic? No Pain Assessment Patient in pain? no       Does patient need assistance? Functional Status Self care Ambulation Normal   CC:  pt would like a referral to go to denist... pt would like to get b12 shot..... and Depression.  History of Present Illness:  Pt into the office with complaints of tooth pain. Pt has been to the dentist about 1 year go but did not go back for f/u. He admits to poor dentation over the years and long history of chewing tobacco and ongoing cigarette use. ETOH use - quit daily drinking back in October 2010 still with some ETOH use but only about once per week (very limited in disclosing information about EtOH use)  "Mother of his unborn child" in room during exam.  Vitamin B12 deficiency - Previously getting Vitamin B12 injections but he has not received any injections in over 6 months.  He admits that he did have more energy when he was taking the injections Comments:  ongoing f/u at the [facility] Cogentin and haldol d/c'd but pt will not tell why.   Habits & Providers  Alcohol-Tobacco-Diet     Alcohol drinks/day: 1     Alcohol Counseling: to decrease amount and/or frequency of alcohol intake     Tobacco Status: current     Tobacco Counseling: to quit use of tobacco products     Cigarette Packs/Day: 2 packs per day  Exercise-Depression-Behavior     Drug Use: yes  Comments: Pt goes to the [facility]  Allergies (verified): No Known Drug  Allergies  Review of Systems General:  Complains of fatigue; dental problems. CV:  Denies chest pain or discomfort. Resp:  Denies cough. GI:  Denies abdominal pain.  Physical Exam  General:  alert.   Head:  normocephalic.   Mouth:  poor dentation  MULTIPLE dental caries - left lower with several broken teeth, general discoloration Lungs:  normal breath sounds.   Heart:  normal rate and regular rhythm.   Abdomen:  normal bowel sounds.   Neurologic:  alert slow reactions and speech Psych:  Oriented X3.     Impression & Recommendations:  Problem # 1:  DENTAL CARIES (ICD-521.00) mouth is in poor condition  he will need extraction of multiple teeth Orders: T-Comprehensive Metabolic Panel ([id number]) Dental Referral (Dentist)  Problem # 2:  VITAMIN B12 DEFICIENCY (ICD-266.2) will check labs today Orders: T- * Misc. 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 1000 mg im monthly 2)  Thiamine Hcl 100 Mg Tabs (Thiamine hcl) .Marland Kitchen.. 1 tab by mouth daily 3)  Lyrica  50 Mg Caps (Pregabalin) .Marland Kitchen.. 1 cap by mouth two times a day 4)  Wellbutrin Sr 150 Mg Xr12h-tab (Bupropion hcl) .... One tablet by mouth daily 5)  Penicillin V Potassium 500 Mg Tabs (Penicillin v potassium) .... One tablet by mouth three times a day for infection 6)  Tramadol Hcl 50 Mg Tabs (Tramadol hcl) .... One tablet by mouth daily as needed for pain  Other Orders: Rapid HIV  ([id number])  Patient Instructions: 1)  You will be referred to the dental clinic.  You will be notified of the time/date of the appointment. 2)  Take the antibiotics as ordered 3)  Your vitamin B12 and other labs will be checked and notified of the  results Prescriptions: TRAMADOL HCL 50 MG TABS (TRAMADOL HCL) One tablet by mouth daily as needed for pain  #20 x 0   Entered and Authorized by:   Lehman Prom FNP   Signed by:   Lehman Prom FNP on 05/12/2009   Method used:   Print then Give to Patient   RxID:   [id number] PENICILLIN V POTASSIUM 500 MG TABS (PENICILLIN V POTASSIUM) One tablet by mouth three times a day for infection  #30` x 0   Entered and Authorized by:   Lehman Prom FNP   Signed by:   Lehman Prom FNP on 05/12/2009   Method used:   Print then Give to Patient   RxID:   [id number]    Orders Added: 1)  Est. Patient Level III [25956] 2)  T-Comprehensive Metabolic Panel [[id number]] 3)  T- * Misc. Laboratory test [99999] 4)  T-TSH [phone] 5)  Rapid HIV  [92370] 6)  Dental Referral [Dentist]  Laboratory Results  Date/Time Received: May 12, 2009 5:25 PM   Other Tests  Rapid HIV: negative

## 2010-03-01 NOTE — Letter (Signed)
Summary: *HSN Results Follow up  [facility]  [address] [place], [place] [address]   Phone: [phone]  Fax: [phone]      05/24/2009   Todd Winters [address] [place], [place]  [address]   Dear  [name],                            ____S.Drinkard,FNP   ____D. Gore,FNP       ____B. McPherson,MD   ____V. Rankins,MD    __X__E. Mulberry,MD    ____N. Daphine Deutscher, FNP  ____D. Reche Dixon, MD    ____K. Philipp Deputy, MD    ____Other     This letter is to inform you that your recent test(s):  _______Pap Smear    ___X____Lab Test     _______X-ray    _______ is within acceptable limits  _______ requires a medication change  ___X____ requires a follow-up lab visit  _______ requires a follow-up visit with your provider   Comments: Please call our office regarding a recent lab work. Thank You.       _________________________________________________________ If you have any questions, please contact our office                     Sincerely,  Vesta Mixer CMA [facility]

## 2010-03-01 NOTE — Letter (Signed)
Summary: PT REQUESTING RECORDS FOR SELF//PICKED UP  PT REQUESTING RECORDS FOR SELF//PICKED UP   Imported By: Arta Bruce 03/02/2009 12:26:43  _____________________________________________________________________  External Attachment:    Type:   Image     Comment:   External Document

## 2010-03-01 NOTE — Assessment & Plan Note (Signed)
Summary: Acute - Sinusitis   Vital Signs:  Patient profile:   40 year old male Weight:      148.5 pounds Temp:     97.3 degrees F Pulse rate:   84 / minute Pulse rhythm:   regular Resp:     20 per minute BP sitting:   112 / 80  (left arm) Cuff size:   regular  Vitals Entered By: Vesta Mixer CMA (Jun 15, 2008 2:28 PM) CC: pt c/o sinus and chest cold x 3 days has tried clairtin D, sudafed and tylenol Is Patient Diabetic? No Pain Assessment Patient in pain? yes     Location: chest/sinus Intensity: 7  Does patient need assistance? Ambulation Normal   History of Present Illness:  Pt into the office with complaint of sinus problems +cough (productive) +sinus pressure +fever +tobacco  -earpain  -sore throat +headache  Pt has been taking claritin D which has helped some. He also took some tylenol for the aches. +tobacco abuse - 2 ppd  Habits & Providers     Tobacco Status: current     Tobacco Counseling: to quit use of tobacco products     Cigarette Packs/Day: 2 packs per day     Drug Use: yes  Current Medications (verified): 1)  Cyanocobalamin 1000 Mcg/ml Inj Soln (Cyanocobalamin) .Marland Kitchen.. 1000 Mg Im Monthly 2)  Thiamine Hcl 100 Mg  Tabs (Thiamine Hcl) .Marland Kitchen.. 1 Tab By Mouth Daily 3)  Lyrica 50 Mg Caps (Pregabalin) .Marland Kitchen.. 1 Cap By Mouth Two Times A Day 4)  Haloperidol 1 Mg Tabs (Haloperidol) .Marland Kitchen.. 1 Tab By Mouth Daily 5)  Benztropine Mesylate 1 Mg Tabs (Benztropine Mesylate) .Marland Kitchen.. 1 Tab By Mouth Daily  Allergies (verified): No Known Drug Allergies  Social History:    Packs/Day:  2 packs per day  Review of Systems General:  Complains of fever. ENT:  Complains of sinus pressure; denies earache, nasal congestion, and sore throat. CV:  Complains of chest pain or discomfort; when coughing. Resp:  Complains of cough.  Physical Exam  General:  alert.  sickly Head:  normocephalic.   Ears:  bil ears with clear fluid behind TM  Nose:  sinus pressure Mouth:  fair  dentition.   Lungs:  few expiratory wheezes Heart:  normal rate and regular rhythm.   Abdomen:  normal bowel sounds.   Msk:  normal ROM.   Neurologic:  alert & oriented X3.   Skin:  color normal.   Psych:  Oriented X3.     Impression & Recommendations:  Problem # 1:  SINUSITIS (ICD-473.9) pt may continue claritin OTC His updated medication list for this problem includes:    Amoxil 500 Mg Caps (Amoxicillin) .Marland Kitchen... 1 tablet by mouth three times a day for infection  Orders: T-CBC w/Diff ([id number])  Problem # 2:  TOBACCO ABUSE (ICD-305.1) advised cessation  Complete Medication List: 1)  Cyanocobalamin 1000 Mcg/ml Inj Soln (Cyanocobalamin) .Marland Kitchen.. 1000 mg im monthly 2)  Thiamine Hcl 100 Mg Tabs (Thiamine hcl) .Marland Kitchen.. 1 tab by mouth daily 3)  Lyrica 50 Mg Caps (Pregabalin) .Marland Kitchen.. 1 cap by mouth two times a day 4)  Haloperidol 1 Mg Tabs (Haloperidol) .Marland Kitchen.. 1 tab by mouth daily 5)  Benztropine Mesylate 1 Mg Tabs (Benztropine mesylate) .Marland Kitchen.. 1 tab by mouth daily 6)  Amoxil 500 Mg Caps (Amoxicillin) .Marland Kitchen.. 1 tablet by mouth three times a day for infection  Patient Instructions: 1)  Take medications as ordered 2)  Follow up as needed if symptoms continue  or worsen. Prescriptions: AMOXIL 500 MG CAPS (AMOXICILLIN) 1 tablet by mouth three times a day for infection  #30 x 0   Entered and Authorized by:   Lehman Prom FNP   Signed by:   Lehman Prom FNP on 06/15/2008   Method used:   Print then Give to Patient   RxID:   [id number]

## 2010-03-01 NOTE — Assessment & Plan Note (Signed)
Summary: B12 INJ / NS  Nurse Visit    Prior Medications: CYANOCOBALAMIN 1000 MCG/ML INJ SOLN (CYANOCOBALAMIN) 1000 mg IM monthly HALOPERIDOL 1 MG  TABS (HALOPERIDOL) 1-2 tabs by mouth at bedtime. BUPROPION HCL 100 MG  TABS (BUPROPION HCL) 1 tab by mouth daily BENZTROPINE MESYLATE 1 MG  TABS (BENZTROPINE MESYLATE) 1/2 to 1 tab by mouth qhs THIAMINE HCL 100 MG  TABS (THIAMINE HCL) 1 tab by mouth daily NEURONTIN 300 MG  CAPS (GABAPENTIN) 1 cap by mouth at bedtime for 3 nights, then 2 caps by mouth at bedtime. IBUPROFEN 600 MG  TABS (IBUPROFEN) 1 tab by mouth three times a day with  food. Current Allergies: No known allergies     Medication Administration  Injection # 1:    Medication: Vit B12 1000 mcg    Diagnosis: VITAMIN B12 DEFICIENCY (ICD-266.2)    ]

## 2010-03-03 NOTE — Letter (Signed)
Summary: *HSN Results Follow up  [facility]  [address] [place], [place] [address]   Phone: [phone]  Fax: [phone]      01/19/2010   JEDIDIAH DEMARTINI [address] [place], [place]  [address]   Dear  [name],                            ____S.Drinkard,FNP   ____D. Gore,FNP       ____B. McPherson,MD   ____V. Rankins,MD    __X__E. Nahshon Reich,MD    ____N. Daphine Deutscher, FNP  ____D. Reche Dixon, MD    ____K. Philipp Deputy, MD    ____Other     This letter is to inform you that your recent test(s):  _______Pap Smear    ___X____Lab Test     _______X-ray    ___X____ is within acceptable limits  _______ requires a medication change  _______ requires a follow-up lab visit  _______ requires a follow-up visit with your provider   Comments:  B12 level okay--please make sure you come in monthly for shot       _________________________________________________________ If you have any questions, please contact our office                     Sincerely,  Julieanne Manson MD [facility]

## 2010-03-07 ENCOUNTER — Encounter (INDEPENDENT_AMBULATORY_CARE_PROVIDER_SITE_OTHER): Payer: Self-pay | Admitting: Internal Medicine

## 2010-04-05 ENCOUNTER — Encounter (INDEPENDENT_AMBULATORY_CARE_PROVIDER_SITE_OTHER): Payer: Self-pay | Admitting: Internal Medicine

## 2010-04-16 LAB — BASIC METABOLIC PANEL
BUN: 11 mg/dL (ref 6–23)
CO2: 33 mEq/L — ABNORMAL HIGH (ref 19–32)
Calcium: 9.2 mg/dL (ref 8.4–10.5)
Chloride: 104 mEq/L (ref 96–112)
Creatinine, Ser: 1 mg/dL (ref 0.4–1.5)
GFR calc Af Amer: >60 mL/min (ref >60)
GFR calc non Af Amer: >60 mL/min (ref >60)
Glucose, Bld: 98 mg/dL (ref 70–99)
Potassium: 4.6 mEq/L (ref 3.5–5.1)
Sodium: 141 mEq/L (ref 135–145)

## 2010-04-16 LAB — CBC
HCT: 40.3 % (ref 39.0–52.0)
Hemoglobin: 14.1 g/dL (ref 13.0–17.0)
MCH: 34.8 pg — ABNORMAL HIGH (ref 26.0–34.0)
MCHC: 34.9 g/dL (ref 30.0–36.0)
MCV: 99.6 fL (ref 78.0–100.0)
Platelets: 290 10*3/uL (ref 150–400)
RBC: 4.05 MIL/uL — ABNORMAL LOW (ref 4.22–5.81)
RDW: 14 % (ref 11.5–15.5)
WBC: 7.6 10*3/uL (ref 4.0–10.5)

## 2010-04-16 LAB — ETHANOL: Alcohol, Ethyl (B): <5 mg/dL (ref 0–10)

## 2010-04-16 LAB — DIFFERENTIAL
Basophils Absolute: 0 10*3/uL (ref 0.0–0.1)
Basophils Relative: 0 % (ref 0–1)
Eosinophils Absolute: 0.1 10*3/uL (ref 0.0–0.7)
Eosinophils Relative: 2 % (ref 0–5)
Lymphocytes Relative: 29 % (ref 12–46)
Lymphs Abs: 2.2 10*3/uL (ref 0.7–4.0)
Monocytes Absolute: 0.6 10*3/uL (ref 0.1–1.0)
Monocytes Relative: 9 % (ref 3–12)
Neutro Abs: 4.7 10*3/uL (ref 1.7–7.7)
Neutrophils Relative %: 61 % (ref 43–77)

## 2010-04-16 LAB — RAPID URINE DRUG SCREEN, HOSP PERFORMED
Amphetamines: NOT DETECTED
Barbiturates: NOT DETECTED
Benzodiazepines: NOT DETECTED
Cocaine: NOT DETECTED
Opiates: NOT DETECTED
Tetrahydrocannabinol: NOT DETECTED

## 2010-04-16 LAB — TSH: TSH: 1.007 u[IU]/mL (ref 0.350–4.500)

## 2010-04-29 ENCOUNTER — Telehealth (INDEPENDENT_AMBULATORY_CARE_PROVIDER_SITE_OTHER): Payer: Self-pay | Admitting: Internal Medicine

## 2010-05-03 NOTE — Progress Notes (Signed)
Summary: Acute appt needed  Phone Note Call from Patient Call back at Home Phone [phone]   Caller: Patient Summary of Call: Urgent appt, thinks sinus/flu, otc meds not helping Initial call taken by: Ernestine Mcmurray,  April 29, 2010 8:17 AM  Follow-up for Phone Call        Sore throat, off and on fever, feels like something stuck in back of throat, slight productive cough, yellow/green mucus, HA across forehead.  Slight nasal drainage, some congestion.  Has tried actifed, sudafed, advil.  Fever has come down, still has sore throat.  Advised he can use mucinex to loosen secretions, drink plenty of water, humidfy home, warm showers to loosen congestion, cough drops to soothe throat, gargle with salt and water several times a day to relieve itch and soreness, OTC ibuprofen or tylenol to relieve discomfort.  Also to sleep with the windows closed during high pollen season.  If develops fever, SOB or worsening symptoms, to go to ED or UC for evaluation and treatment.  Verbalized understanding and agreement.  Follow-up by: Dutch Quint RN,  April 29, 2010 3:42 PM

## 2012-05-18 ENCOUNTER — Encounter (HOSPITAL_COMMUNITY): Payer: Self-pay | Admitting: Licensed Clinical Social Worker

## 2012-05-18 ENCOUNTER — Encounter (HOSPITAL_COMMUNITY): Payer: Self-pay | Admitting: Emergency Medicine

## 2012-05-18 ENCOUNTER — Emergency Department (HOSPITAL_COMMUNITY)
Admission: EM | Admit: 2012-05-18 | Discharge: 2012-05-19 | Disposition: A | Discharge status: Other Acute Inpatient Hospital | Payer: Medicare Other | Attending: Emergency Medicine | Admitting: Emergency Medicine

## 2012-05-18 ENCOUNTER — Ambulatory Visit (HOSPITAL_COMMUNITY)
Admission: AD | Admit: 2012-05-18 | Discharge: 2012-05-18 | Disposition: A | Discharge status: Home / Self Care | Payer: Medicare Other | Attending: Psychiatry | Admitting: Psychiatry

## 2012-05-18 DIAGNOSIS — F209 Schizophrenia, unspecified: Secondary | ICD-10-CM

## 2012-05-18 DIAGNOSIS — E538 Deficiency of other specified B group vitamins: Secondary | ICD-10-CM | POA: Insufficient documentation

## 2012-05-18 DIAGNOSIS — F17200 Nicotine dependence, unspecified, uncomplicated: Secondary | ICD-10-CM | POA: Insufficient documentation

## 2012-05-18 DIAGNOSIS — Z79899 Other long term (current) drug therapy: Secondary | ICD-10-CM | POA: Insufficient documentation

## 2012-05-18 DIAGNOSIS — Z8669 Personal history of other diseases of the nervous system and sense organs: Secondary | ICD-10-CM | POA: Insufficient documentation

## 2012-05-18 DIAGNOSIS — F200 Paranoid schizophrenia: Secondary | ICD-10-CM | POA: Insufficient documentation

## 2012-05-18 DIAGNOSIS — F3289 Other specified depressive episodes: Secondary | ICD-10-CM | POA: Insufficient documentation

## 2012-05-18 DIAGNOSIS — N429 Disorder of prostate, unspecified: Secondary | ICD-10-CM | POA: Insufficient documentation

## 2012-05-18 DIAGNOSIS — R4789 Other speech disturbances: Secondary | ICD-10-CM | POA: Insufficient documentation

## 2012-05-18 DIAGNOSIS — F329 Major depressive disorder, single episode, unspecified: Secondary | ICD-10-CM | POA: Insufficient documentation

## 2012-05-18 DIAGNOSIS — F32A Depression, unspecified: Secondary | ICD-10-CM

## 2012-05-18 DIAGNOSIS — F29 Unspecified psychosis not due to a substance or known physiological condition: Secondary | ICD-10-CM | POA: Insufficient documentation

## 2012-05-18 DIAGNOSIS — R350 Frequency of micturition: Secondary | ICD-10-CM | POA: Insufficient documentation

## 2012-05-18 HISTORY — DX: Paranoid schizophrenia: F20.0

## 2012-05-18 HISTORY — DX: Deficiency of other specified B group vitamins: E53.8

## 2012-05-18 HISTORY — DX: Polyneuropathy, unspecified: G62.9

## 2012-05-18 LAB — RAPID URINE DRUG SCREEN, HOSP PERFORMED
Amphetamines: NOT DETECTED
Barbiturates: NOT DETECTED
Benzodiazepines: NOT DETECTED
Cocaine: NOT DETECTED
Opiates: NOT DETECTED
Tetrahydrocannabinol: NOT DETECTED

## 2012-05-18 LAB — CBC WITH DIFFERENTIAL/PLATELET
Basophils Absolute: 0 10*3/uL (ref 0.0–0.1)
Basophils Relative: 0 % (ref 0–1)
Eosinophils Absolute: 0 10*3/uL (ref 0.0–0.7)
Eosinophils Relative: 0 % (ref 0–5)
HCT: 42.9 % (ref 39.0–52.0)
Hemoglobin: 15.1 g/dL (ref 13.0–17.0)
Lymphocytes Relative: 13 % (ref 12–46)
Lymphs Abs: 1.7 10*3/uL (ref 0.7–4.0)
MCH: 33.5 pg (ref 26.0–34.0)
MCHC: 35.2 g/dL (ref 30.0–36.0)
MCV: 95.1 fL (ref 78.0–100.0)
Monocytes Absolute: 0.9 10*3/uL (ref 0.1–1.0)
Monocytes Relative: 7 % (ref 3–12)
Neutro Abs: 10.1 10*3/uL — ABNORMAL HIGH (ref 1.7–7.7)
Neutrophils Relative %: 79 % — ABNORMAL HIGH (ref 43–77)
Platelets: 359 10*3/uL (ref 150–400)
RBC: 4.51 MIL/uL (ref 4.22–5.81)
RDW: 13 % (ref 11.5–15.5)
WBC: 12.7 10*3/uL — ABNORMAL HIGH (ref 4.0–10.5)

## 2012-05-18 LAB — COMPREHENSIVE METABOLIC PANEL
ALT: 8 U/L (ref 0–53)
AST: 10 U/L (ref 0–37)
Albumin: 4.1 g/dL (ref 3.5–5.2)
Alkaline Phosphatase: 56 U/L (ref 39–117)
BUN: 13 mg/dL (ref 6–23)
CO2: 25 mEq/L (ref 19–32)
Calcium: 9.2 mg/dL (ref 8.4–10.5)
Chloride: 103 mEq/L (ref 96–112)
Creatinine, Ser: 0.82 mg/dL (ref 0.50–1.35)
GFR calc Af Amer: >90 mL/min (ref >90)
GFR calc non Af Amer: >90 mL/min (ref >90)
Glucose, Bld: 155 mg/dL — ABNORMAL HIGH (ref 70–99)
Potassium: 3.7 mEq/L (ref 3.5–5.1)
Sodium: 138 mEq/L (ref 135–145)
Total Bilirubin: 0.6 mg/dL (ref 0.3–1.2)
Total Protein: 7.2 g/dL (ref 6.0–8.3)

## 2012-05-18 LAB — URINALYSIS, ROUTINE W REFLEX MICROSCOPIC
Glucose, UA: 500 mg/dL — AB
Ketones, ur: NEGATIVE mg/dL
Leukocytes, UA: NEGATIVE
Nitrite: NEGATIVE
Protein, ur: NEGATIVE mg/dL
Specific Gravity, Urine: 1.032 — ABNORMAL HIGH (ref 1.005–1.030)
Urobilinogen, UA: 1 mg/dL (ref 0.0–1.0)
pH: 6 (ref 5.0–8.0)

## 2012-05-18 LAB — ETHANOL: Alcohol, Ethyl (B): <11 mg/dL (ref 0–11)

## 2012-05-18 LAB — URINE MICROSCOPIC-ADD ON

## 2012-05-18 LAB — RPR: RPR Ser Ql: NONREACTIVE

## 2012-05-18 MED ORDER — NICOTINE 21 MG/24HR TD PT24
21.0000 mg | MEDICATED_PATCH | Freq: Every day | TRANSDERMAL | Status: DC
  Administered 2012-05-18: 21 mg via TRANSDERMAL
  Filled 2012-05-18: qty 1

## 2012-05-18 MED ORDER — ACETAMINOPHEN 325 MG PO TABS: 650.0000 mg | ORAL_TABLET | ORAL | Status: DC | PRN

## 2012-05-18 MED ORDER — LORAZEPAM 1 MG PO TABS: 1.0000 mg | ORAL_TABLET | Freq: Three times a day (TID) | ORAL | Status: DC | PRN

## 2012-05-18 MED ORDER — PREGABALIN 50 MG PO CAPS
50.0000 mg | ORAL_CAPSULE | Freq: Every day | ORAL | Status: DC
  Administered 2012-05-18: 50 mg via ORAL
  Filled 2012-05-18: qty 1

## 2012-05-18 MED ORDER — ONDANSETRON HCL 4 MG PO TABS: 4.0000 mg | ORAL_TABLET | Freq: Three times a day (TID) | ORAL | Status: DC | PRN

## 2012-05-18 MED ORDER — HALOPERIDOL 2 MG PO TABS
2.0000 mg | ORAL_TABLET | Freq: Every day | ORAL | Status: DC
  Administered 2012-05-18: 2 mg via ORAL
  Filled 2012-05-18: qty 1

## 2012-05-18 MED ORDER — ZOLPIDEM TARTRATE 5 MG PO TABS
5.0000 mg | ORAL_TABLET | Freq: Every evening | ORAL | Status: DC | PRN
  Administered 2012-05-18: 5 mg via ORAL
  Filled 2012-05-18: qty 1

## 2012-05-18 NOTE — ED Notes (Signed)
Patient has one personal belonging bag I put it in locker 41

## 2012-05-18 NOTE — ED Notes (Signed)
Pt at nursing station talking to his wife on the phone.

## 2012-05-18 NOTE — ED Notes (Addendum)
Wife doesn't want pt to sit here all weekend waiting for a bed across the street. [facility] is her first choice because his primary care is through [facility]. But she just really wants him in treatment as quickly as possible. Wife wants to be contacted as soon as pt is placed. There's a release in the chart and she says she's power of attorney over everything for him as well.  Pt asked about his night time meds, explained writer asked that they be ordered for him and are usually given around 10pm.

## 2012-05-18 NOTE — ED Notes (Signed)
PA to order home medications.  

## 2012-05-18 NOTE — ED Notes (Signed)
No beds at [facility], told to call at 10am tomorrow to see if any discharges. [facility] probably won't have bed until Monday. CSW will try other places when she gets a chance.

## 2012-05-18 NOTE — ED Provider Notes (Deleted)
Medical screening examination/treatment/procedure(s) were performed by non-physician practitioner and as supervising physician I was immediately available for consultation/collaboration.  Doug Sou, MD 05/18/12 [phone]

## 2012-05-18 NOTE — ED Notes (Signed)
RN to leave note for PA to order home medications.  

## 2012-05-18 NOTE — ED Notes (Signed)
Pt said over the last month his most recent stressors are waiting for his disability to be renewed and his kids getting into everything. He doesn't know if diabetes runs in his family as he is adopted. Pt said he's been hospitalized psychiatrically twice, once when diagnosed paranoid schizophrenia and another time when he was off his medications.

## 2012-05-18 NOTE — ED Provider Notes (Signed)
History  This chart was scribed for non-physician practitioner Elpidio Anis, PA-C working with Doug Sou, MD, by Candelaria Stagers, ED Scribe. This patient was seen in room WTR4/WLPT4 and the patient's care was started at 3:34 PM   CSN: [id number]  Arrival date & time 05/18/12  1458   First MD Initiated Contact with Patient 05/18/12 1533      Chief Complaint  Patient presents with  . Medical Clearance     The history is provided by the patient and the spouse. No language interpreter was used.   Todd Winters is a 42 y.o. male who presents to the Emergency Department for medical clearance to be accepted at behavioral health for treatment. He was started on Wellbutrin recently to attempt to improve symptoms but he stopped this medication because of unwanted side effects.  His wife reports he has been depressed, paranoid, and more confused than normal and is exhibiting out of character behaviors including washing clothes and car more than normal.  Pt denies hallucinations, hearing voices, or SI/HI.  He has also been experiencing increased urination and has h/o prostate problems.  He denies recent drug or alcohol use.       Past Medical History  Diagnosis Date  . Paranoid schizophrenia   . Neuropathy   . Vitamin B 12 deficiency     History reviewed. No pertinent past surgical history.  No family history on file.  History  Substance Use Topics  . Smoking status: Current Every Day Smoker -- 2.00 packs/day  . Smokeless tobacco: Not on file  . Alcohol Use: No     Comment: HIstory of cocaine abuse      Review of Systems  Genitourinary: Positive for frequency.  Psychiatric/Behavioral: Positive for behavioral problems and confusion. Negative for suicidal ideas and hallucinations.  All other systems reviewed and are negative.    Allergies  Review of patient's allergies indicates no known allergies.  Home Medications   Current Outpatient Rx  Name  Route  Sig  Dispense   Refill  . haloperidol (HALDOL) 2 MG tablet   Oral   Take 2 mg by mouth at bedtime.         Marland Kitchen omeprazole (PRILOSEC) 20 MG capsule   Oral   Take 20 mg by mouth daily.         . pregabalin (LYRICA) 50 MG capsule   Oral   Take 50 mg by mouth daily.         . vitamin B-12 (CYANOCOBALAMIN) 1000 MCG tablet   Oral   Take 1,000 mcg by mouth daily.           BP 142/84  Pulse 129  Temp(Src) 98.7 F (37.1 C) (Oral)  Resp 16  SpO2 100%  Physical Exam  Nursing note and vitals reviewed. Constitutional: He is oriented to person, place, and time. He appears well-developed and well-nourished. No distress.  HENT:  Head: Normocephalic and atraumatic.  Eyes: EOM are normal.  Neck: Neck supple. No tracheal deviation present.  Cardiovascular: Normal rate.   Pulmonary/Chest: Effort normal. No respiratory distress.  Musculoskeletal: Normal range of motion.  Neurological: He is alert and oriented to person, place, and time.  Skin: Skin is warm and dry.  Psychiatric: He has a normal mood and affect. His behavior is normal. His speech is delayed.  Flat affect.  Does not show impaired thought process.      ED Course  Procedures   DIAGNOSTIC STUDIES: Oxygen Saturation is 100% on room air,  normal by my interpretation.    COORDINATION OF CARE:  3:36 PM Discussed course of care with pt which includes blood work and consult with behavioral health.  Pt understands and agrees.    Labs Reviewed  CBC WITH DIFFERENTIAL  COMPREHENSIVE METABOLIC PANEL  ETHANOL  URINE RAPID DRUG SCREEN (HOSP PERFORMED)  URINALYSIS, ROUTINE W REFLEX MICROSCOPIC   No results found.   No diagnosis found.    MDM  His wife reports a concern for syphilis based on questionable previous exposure. No skin lesion or rash noted. Agreed to add RPR to evaluate.   I personally performed the services described in this documentation, which was scribed in my presence. The recorded information has been reviewed and  is accurate.        Arnoldo Hooker, PA-C 05/18/12 1608

## 2012-05-18 NOTE — ED Notes (Signed)
Per family member, depressed for about a month, was placed on Wellbutrin which made him want to jump out of his skin-has not been taking it-SI since last night
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
## 2012-05-18 NOTE — ED Notes (Signed)
Wife at bedside, pt eating.  Wife Pinchas Reither) is HCPOA and will have the papers brought in later tonight.  Contact # [phone]:  [phone].  She reports that she ahs been in contact w./ his primary care md who is w/ [facility] and if he needs to be transferred she request [facility] if possible.

## 2012-05-19 MED ORDER — ZIPRASIDONE MESYLATE 20 MG IM SOLR
20.0000 mg | Freq: Two times a day (BID) | INTRAMUSCULAR | Status: DC | PRN
  Administered 2012-05-19: 20 mg via INTRAMUSCULAR
  Filled 2012-05-19: qty 20

## 2012-05-19 MED ORDER — LORAZEPAM 1 MG PO TABS: 2.0000 mg | ORAL_TABLET | Freq: Four times a day (QID) | ORAL | Status: DC | PRN

## 2012-05-19 NOTE — ED Notes (Signed)
Dr Fonnie Jarvis into see

## 2012-05-19 NOTE — ED Provider Notes (Signed)
Pt calm and cooperative; wife refuses hospital to provide transport; wife wants Pt to transfer by POV as does Pt; Pt calm without SI plan, appears low risk to elope or decompensate enroute and transfer by POV appears reasonable.  Hurman Horn, MD 05/24/12 [phone]

## 2012-05-19 NOTE — ED Provider Notes (Signed)
Assuming care of patient this morning. Patient in the ED for schizophrenia and depression evaluation. Awaiting transfer to outside facility, likely [facility] Workup thus far is negative. Patient had no complains, no concerns from the nursing side. Will continue to monitor.  Filed Vitals:   05/18/12 2222  BP: 126/75  Pulse: 80  Temp: 98.2 F (36.8 C)  Resp: 20      Derwood Kaplan, MD 05/19/12 0831

## 2012-05-19 NOTE — ED Notes (Signed)
Pt ambulatory, dc'd w/ wife for transport to [facility].  Map, directions, contact number give to wife.  Belongings returned after Pt left unit.

## 2012-05-19 NOTE — ED Notes (Signed)
Per CSW pt is accepted to [facility] but cannot go until after 10am.

## 2012-05-19 NOTE — ED Notes (Signed)
To the bathroom 

## 2012-05-19 NOTE — ED Notes (Signed)
Edwina RN updated w/ recent set of vitals.  Pt reports he is having suicidal thoughts but is able to contact w/ wife and staff for safety.  Wife reports she feels comfortable driving [facility] to [facility], Production assistant, radio [facility] aware

## 2012-05-19 NOTE — ED Provider Notes (Addendum)
Pt reports intermittently depressed and suicidal  Due to his "disability" which is schizphrenia. Pt alert gcs 15 oriented times 3 . Ambulatory, pleasant and cooperative Results for orders placed during the hospital encounter of 05/18/12  CBC WITH DIFFERENTIAL      Result Value Range   WBC 12.7 (*) 4.0 - 10.5 K/uL   RBC 4.51  4.22 - 5.81 MIL/uL   Hemoglobin 15.1  13.0 - 17.0 g/dL   HCT 16.1  09.6 - 04.5 %   MCV 95.1  78.0 - 100.0 fL   MCH 33.5  26.0 - 34.0 pg   MCHC 35.2  30.0 - 36.0 g/dL   RDW 40.9  81.1 - 91.4 %   Platelets 359  150 - 400 K/uL   Neutrophils Relative 79 (*) 43 - 77 %   Neutro Abs 10.1 (*) 1.7 - 7.7 K/uL   Lymphocytes Relative 13  12 - 46 %   Lymphs Abs 1.7  0.7 - 4.0 K/uL   Monocytes Relative 7  3 - 12 %   Monocytes Absolute 0.9  0.1 - 1.0 K/uL   Eosinophils Relative 0  0 - 5 %   Eosinophils Absolute 0.0  0.0 - 0.7 K/uL   Basophils Relative 0  0 - 1 %   Basophils Absolute 0.0  0.0 - 0.1 K/uL  COMPREHENSIVE METABOLIC PANEL      Result Value Range   Sodium 138  135 - 145 mEq/L   Potassium 3.7  3.5 - 5.1 mEq/L   Chloride 103  96 - 112 mEq/L   CO2 25  19 - 32 mEq/L   Glucose, Bld 155 (*) 70 - 99 mg/dL   BUN 13  6 - 23 mg/dL   Creatinine, Ser 7.82  0.50 - 1.35 mg/dL   Calcium 9.2  8.4 - 95.6 mg/dL   Total Protein 7.2  6.0 - 8.3 g/dL   Albumin 4.1  3.5 - 5.2 g/dL   AST 10  0 - 37 U/L   ALT 8  0 - 53 U/L   Alkaline Phosphatase 56  39 - 117 U/L   Total Bilirubin 0.6  0.3 - 1.2 mg/dL   GFR calc non Af Amer >90  >90 mL/min   GFR calc Af Amer >90  >90 mL/min  ETHANOL      Result Value Range   Alcohol, Ethyl (B) <11  0 - 11 mg/dL  URINE RAPID DRUG SCREEN (HOSP PERFORMED)      Result Value Range   Opiates NONE DETECTED  NONE DETECTED   Cocaine NONE DETECTED  NONE DETECTED   Benzodiazepines NONE DETECTED  NONE DETECTED   Amphetamines NONE DETECTED  NONE DETECTED   Tetrahydrocannabinol NONE DETECTED  NONE DETECTED   Barbiturates NONE DETECTED  NONE DETECTED   URINALYSIS, ROUTINE W REFLEX MICROSCOPIC      Result Value Range   Color, Urine YELLOW  YELLOW   APPearance CLEAR  CLEAR   Specific Gravity, Urine 1.032 (*) 1.005 - 1.030   pH 6.0  5.0 - 8.0   Glucose, UA 500 (*) NEGATIVE mg/dL   Hgb urine dipstick TRACE (*) NEGATIVE   Bilirubin Urine SMALL (*) NEGATIVE   Ketones, ur NEGATIVE  NEGATIVE mg/dL   Protein, ur NEGATIVE  NEGATIVE mg/dL   Urobilinogen, UA 1.0  0.0 - 1.0 mg/dL   Nitrite NEGATIVE  NEGATIVE   Leukocytes, UA NEGATIVE  NEGATIVE  RPR      Result Value Range   RPR NON REACTIVE  NON REACTIVE  URINE MICROSCOPIC-ADD ON      Result Value Range   RBC / HPF 3-6  <3 RBC/hpf   Bacteria, UA FEW (*) RARE   Urine-Other MUCOUS PRESENT     No results found.   Medical screening examination/treatment/procedure(s) were conducted as a shared visit with non-physician practitioner(s) and myself.  I personally evaluated the patient during the encounter  Doug Sou, MD 05/19/12 0002  Doug Sou, MD 05/19/12 Marlyne Beards  Doug Sou, MD 05/19/12 4098

## 2012-05-19 NOTE — ED Notes (Signed)
Wife called to see how pt did throughout the night. She said she'll be here at about 9am and will transport him to [facility].

## 2012-05-19 NOTE — [facility] Counselor (Signed)
TC to pt's wife to inform her pt has been accepted to [facility]. Wife says she will be at [facility] at 9 am and transport him directly to [facility]. She thanked Clinical research associate for psych ed staff's effort to get him placed quickly.  Evette Cristal, [place] Assessment Counselor

## 2012-05-19 NOTE — ED Notes (Signed)
Transfer report, MAR, AVS, telepsych report, ACT eval, EDP eval, EMTELA sent w/ pt.

## 2012-05-19 NOTE — ED Notes (Addendum)
Wife Is here to transport

## 2012-05-19 NOTE — [facility] Counselor (Addendum)
Per Christiane Ha at [facility], pt accepted by Dr. Betti Cruz. # to call report is [phone]. Pt is voluntary, so pt's wife should bring pt to OV after 10 am.  Nedra Hai at [facility] says no beds available and she isn't sure when there will be discharges. Can call after 10 am for bed status update.  Gabriel Cirri at [facility] states bed on [address] won't be available until 05/20/12 at earliest   Evette Cristal, [place] Assessment Counselor

## 2012-06-13 ENCOUNTER — Other Ambulatory Visit (HOSPITAL_COMMUNITY): Payer: Medicare Other | Attending: Psychiatry

## 2012-06-13 ENCOUNTER — Telehealth (HOSPITAL_COMMUNITY): Payer: Self-pay

## 2012-06-13 NOTE — Telephone Encounter (Signed)
06/13/12 Mailed information to Dr. Thedore Mins office/sh

## 2012-06-14 ENCOUNTER — Ambulatory Visit (HOSPITAL_COMMUNITY): Payer: Self-pay

## 2012-06-17 ENCOUNTER — Ambulatory Visit (HOSPITAL_COMMUNITY): Payer: Self-pay

## 2012-06-18 ENCOUNTER — Ambulatory Visit (HOSPITAL_COMMUNITY): Payer: Self-pay

## 2012-06-19 ENCOUNTER — Ambulatory Visit (HOSPITAL_COMMUNITY): Payer: Self-pay

## 2012-06-20 ENCOUNTER — Ambulatory Visit (HOSPITAL_COMMUNITY): Payer: Self-pay

## 2012-06-21 ENCOUNTER — Ambulatory Visit (HOSPITAL_COMMUNITY): Payer: Self-pay

## 2012-06-25 ENCOUNTER — Ambulatory Visit (HOSPITAL_COMMUNITY): Payer: Self-pay

## 2012-06-26 ENCOUNTER — Ambulatory Visit (HOSPITAL_COMMUNITY): Payer: Self-pay

## 2012-06-27 ENCOUNTER — Ambulatory Visit (HOSPITAL_COMMUNITY): Payer: Self-pay

## 2012-06-28 ENCOUNTER — Ambulatory Visit (HOSPITAL_COMMUNITY): Payer: Self-pay

## 2012-07-01 ENCOUNTER — Ambulatory Visit (HOSPITAL_COMMUNITY): Payer: Self-pay

## 2012-07-02 ENCOUNTER — Encounter: Payer: Self-pay | Admitting: Neurology

## 2012-07-02 ENCOUNTER — Ambulatory Visit (HOSPITAL_COMMUNITY): Payer: Self-pay

## 2012-07-02 ENCOUNTER — Ambulatory Visit (INDEPENDENT_AMBULATORY_CARE_PROVIDER_SITE_OTHER): Payer: Medicare Other | Admitting: Neurology

## 2012-07-02 VITALS — BP 117/78 | HR 93 | Temp 97.5°F | Ht 72.0 in | Wt 174.0 lb

## 2012-07-02 DIAGNOSIS — F1910 Other psychoactive substance abuse, uncomplicated: Secondary | ICD-10-CM

## 2012-07-02 DIAGNOSIS — R413 Other amnesia: Secondary | ICD-10-CM

## 2012-07-02 DIAGNOSIS — F209 Schizophrenia, unspecified: Secondary | ICD-10-CM

## 2012-07-02 DIAGNOSIS — F17200 Nicotine dependence, unspecified, uncomplicated: Secondary | ICD-10-CM

## 2012-07-02 DIAGNOSIS — F203 Undifferentiated schizophrenia: Secondary | ICD-10-CM

## 2012-07-02 NOTE — Patient Instructions (Addendum)
I think overall you are doing fairly well but I do want to suggest a few things today:  Please stop smoking.  Remember to drink plenty of fluid, eat healthy meals and do not skip any meals. Try to eat protein with a every meal and eat a healthy snack such as fruit or nuts in between meals. Try to keep a regular sleep-wake schedule and try to exercise daily, particularly in the form of walking, 20-30 minutes a day, if you can.   Engage in social activities in your community and with your family and try to keep up with current events by reading the newspaper or watching the news.   As far as your medications are concerned, I would like to suggest no new meds.    As far as diagnostic testing: MRI brain, EEG, blood work and referral for cognitive testing.  I would like to see you back in 3 months, sooner if we need to. Please call us with any interim questions, concerns, problems, updates or refill requests.  Please also call us for any test results so we can go over those with you on the phone. Brett Canales is my clinical assistant and will answer any of your questions and relay your messages to me and also relay most of my messages to you.  Our phone number is [phone]. We also have an after hours call service for urgent matters and there is a physician on-call for urgent questions. For any emergencies you know to call 911 or go to the nearest emergency room.

## 2012-07-02 NOTE — Progress Notes (Signed)
Subjective:    Patient ID: Todd Winters is a 42 y.o. male.  HPI  Dear Dr. Lorenso Courier,   I saw your patient, Todd Winters, upon your kind request in my neurologic clinic today for initial consultation for possible early dementia. The patient is accompanied by his wife, Todd Winters, today. As you know, Todd Winters is a 42 year old gentleman with an underlying medical history of vitamin B12 deficiency, schizophrenia diagnosed at age 38 with underlying fetal alcohol syndrome, history of polysubstance abuse, and peripheral neuropathy who was recently hospitalized in 4/14 for exacerbation of his schizophrenia. He was having mostly auditory hallucinations, but claims he was only hearing his own voice. He spends about 3 weeks in [place] and was discharged last month. He has been in an outpatient program through [facility]. During his inpatient stay psychiatry had recommended full neurological evaluation with imaging and was concerned about early dementia. His current medications are BuSpar, Celexa, Neurontin, Zyprexa, Prilosec, Lyrica, and the Road, trazodone, B12, Invega.  There is no known FHx of dementia. There is unclear family Hx of psychiatric illness and known Hx of alcohol abuse. Of note, he was removed as a 42 yo from his home into foster care and was adopted at age 13. His memory problems started to be apparent in 2011.  He smokes 1 to 1 1/2 ppd. He has not had any EtOH for about a month. He has a hx of alcohol abuse. He has not used crack cocaine since 12/12.  His wife reports he has difficulty with short term memory. He currently only drives close distances. He has not gotten lost. He currently denies VH/AH/SI/HI.  He has a 12th grade education.   His Past Medical History Is Significant For: Past Medical History  Diagnosis Date  . Paranoid schizophrenia   . Neuropathy   . Vitamin B 12 deficiency     His Past Surgical History Is Significant For: History reviewed. No pertinent past  surgical history.  His Family History Is Significant For: Family History  Problem Relation Age of Onset  . Adopted: Yes    His Social History Is Significant For: History   Social History  . Marital Status: Single    Spouse Name: N/A    Number of Children: N/A  . Years of Education: N/A   Social History Main Topics  . Smoking status: Current Every Day Smoker -- 2.00 packs/day  . Smokeless tobacco: None  . Alcohol Use: No     Comment: HIstory of cocaine abuse  . Drug Use: No     Comment: history of cocane abuse  . Sexually Active: None   Other Topics Concern  . None   Social History Narrative  . None    His Allergies Are:  No Known Allergies:   His Current Medications Are:  Outpatient Encounter Prescriptions as of 07/02/2012  Medication Sig Dispense Refill  . Amantadine HCl 100 MG tablet       . busPIRone (BUSPAR) 10 MG tablet Take 10 mg by mouth 3 (three) times daily.      . citalopram (CELEXA) 20 MG tablet Take 20 mg by mouth at bedtime.      . gabapentin (NEURONTIN) 100 MG capsule Take 100 mg by mouth 3 (three) times daily.      Marland Kitchen OLANZapine (ZYPREXA) 10 MG tablet Take 10 mg by mouth at bedtime.      Marland Kitchen omeprazole (PRILOSEC) 20 MG capsule Take 20 mg by mouth daily.      Marland Kitchen  Paliperidone Palmitate 117 MG/0.75ML SUSP Inject 1 each into the muscle every 30 (thirty) days.      . pregabalin (LYRICA) 50 MG capsule Take 50 mg by mouth daily.      . propranolol (INDERAL) 10 MG tablet Take 10 mg by mouth 2 (two) times daily.      . traZODone (DESYREL) 100 MG tablet Take 100 mg by mouth as needed.      . vitamin B-12 (CYANOCOBALAMIN) 1000 MCG tablet Take 1,000 mcg by mouth daily.      . [DISCONTINUED] haloperidol (HALDOL) 2 MG tablet Take 2 mg by mouth at bedtime.       No facility-administered encounter medications on file as of 07/02/2012.   Review of Systems  Gastrointestinal: Positive for constipation.  Endocrine: Positive for polydipsia.  Genitourinary:       Impotence   Musculoskeletal: Positive for myalgias.  Allergic/Immunologic: Positive for environmental allergies.  Neurological: Positive for headaches.       Memory loss  Psychiatric/Behavioral: Positive for suicidal ideas, hallucinations, confusion and dysphoric mood. The patient is nervous/anxious.        Racing thoughts    Objective:  Neurologic Exam  Physical Exam Physical Examination:   Filed Vitals:   07/02/12 0839  BP: 117/78  Pulse: 93  Temp: 97.5 F (36.4 C)    General Examination: The patient is a very pleasant 42 y.o. male in no acute distress. He appears well-developed and adequately groomed.   HEENT: Normocephalic, atraumatic, pupils are equal, round and reactive to light and accommodation. Funduscopic exam is normal with sharp disc margins noted. Extraocular tracking is good without limitation to gaze excursion or nystagmus noted. Normal smooth pursuit is noted. Hearing is grossly intact. Tympanic membranes are clear bilaterally. Face is symmetric with normal facial animation and normal facial sensation. Speech is clear with no dysarthria noted. There is no hypophonia. There is no lip, neck/head, jaw or voice tremor. Neck is supple with full range of passive and active motion. There are no carotid bruits on auscultation. Oropharynx exam reveals: mild mouth dryness, poor dental hygiene and mild airway crowding, due to elongated uvula. Mallampati is class I. Tongue protrudes centrally and palate elevates symmetrically.   Chest: Clear to auscultation without wheezing, rhonchi or crackles noted.  Heart: S1+S2+0, regular and normal without murmurs, rubs or gallops noted.   Abdomen: Soft, non-tender and non-distended with normal bowel sounds appreciated on auscultation.  Extremities: There is no pitting edema in the distal lower extremities bilaterally. Pedal pulses are intact.  Skin: Warm and dry without trophic changes noted. There are no varicose veins.  Musculoskeletal: exam  reveals no obvious joint deformities, tenderness or joint swelling or erythema.   Neurologically:  Mental status: The patient is awake, alert and oriented in all 4 spheres. His memory, attention, language and knowledge are mildly impaired. MMSE was 26/30 and he lost one point on remote recall and 3 points on serial sevens. His animal fluency was 11, clock drawing was 4 out of 4. There is no aphasia, agnosia, apraxia or anomia. There is some inattention and psychomotor retardation. Speech is clear with normal enunciation, but some monotony. Thought process is linear, he does not need redirection. Mood is mildly depressed and affect is flat.  Cranial nerves are as described above under HEENT exam. In addition, shoulder shrug is normal with equal shoulder height noted. Motor exam: Normal bulk, strength and tone is noted. There is no drift, tremor or rebound. Romberg is negative. Reflexes are  2+ throughout. Toes are downgoing bilaterally. Fine motor skills are intact with normal finger taps, normal hand movements, normal rapid alternating patting, normal foot taps and normal foot agility.  Cerebellar testing shows no dysmetria or intention tremor on finger to nose testing. Heel to shin is unremarkable bilaterally. There is no truncal or gait ataxia.  Sensory exam is intact to light touch, pinprick, vibration, temperature sense in the upper extremities, but there was decrease in PP sensation in his LE distally.  Gait, station and balance are unremarkable. No veering to one side is noted. No leaning to one side is noted. Posture is age-appropriate to mildly stooped for age and stance is narrow based. No problems turning are noted. He turns en bloc. Tandem walk is difficult. Intact toe and heel stance is noted.               Assessment and Plan:   In summary, Ranbir Chew is a 42 y.o.-year old male with a history of polysubstance abuse, schizophrenia and prior diagnosis of fetal alcohol syndrome, current daily  smoker, who has been experiencing memory loss since 2011. His exam shows non focal findings with the exception of mild peripheral neuropathy, which may be toxic/metabolic (i.e. EtOH related). His MMSE was 26/30, clock drawing was 4/4 and category fluency was 11 (reduced for age). His exam is noteworthy for psychomotor retardation and I do not believe his exam is c/w dementia, but he may have MCI, mild cognitive impairment, but the picture is confounded by his medications, his underlying mental illness, and prior drug abuse as well as current nicotine abuse. I would like to proceed with further testing in the form of MRI brain, EEG and blood work. In addition I would like to request formal neurocognitive testing. He and his wife were in agreement. I did not suggest any new medications and would like to see him back in 3 months from now, sooner if the need arises. I answered all their questions today.   Thank you very much for allowing me to participate in the care of this nice patient. If I can be of any further assistance to you please do not hesitate to call me at [phone].  Sincerely,   Huston Foley, MD, PhD

## 2012-07-03 ENCOUNTER — Ambulatory Visit (HOSPITAL_COMMUNITY): Payer: Self-pay

## 2012-07-03 LAB — COMPREHENSIVE METABOLIC PANEL
ALT: 16 IU/L (ref 0–44)
AST: 14 IU/L (ref 0–40)
Albumin/Globulin Ratio: 2 (ref 1.1–2.5)
Albumin: 4.7 g/dL (ref 3.5–5.5)
Alkaline Phosphatase: 59 IU/L (ref 39–117)
BUN/Creatinine Ratio: 9 (ref 9–20)
BUN: 9 mg/dL (ref 6–24)
CO2: 26 mmol/L (ref 19–28)
Calcium: 9.8 mg/dL (ref 8.7–10.2)
Chloride: 101 mmol/L (ref 97–108)
Creatinine, Ser: 0.97 mg/dL (ref 0.76–1.27)
GFR calc Af Amer: 112 mL/min/{1.73_m2} (ref >59)
GFR calc non Af Amer: 97 mL/min/{1.73_m2} (ref >59)
Globulin, Total: 2.4 g/dL (ref 1.5–4.5)
Glucose: 82 mg/dL (ref 65–99)
Potassium: 4.3 mmol/L (ref 3.5–5.2)
Sodium: 142 mmol/L (ref 134–144)
Total Bilirubin: 0.4 mg/dL (ref 0.0–1.2)
Total Protein: 7.1 g/dL (ref 6.0–8.5)

## 2012-07-03 LAB — CBC WITH DIFFERENTIAL
Basophils Absolute: 0 10*3/uL (ref 0.0–0.2)
Basos: 0 % (ref 0–3)
Eos: 0 % (ref 0–5)
Eosinophils Absolute: 0.1 10*3/uL (ref 0.0–0.4)
HCT: 42.3 % (ref 37.5–51.0)
Hemoglobin: 14.4 g/dL (ref 12.6–17.7)
Immature Grans (Abs): 0 10*3/uL (ref 0.0–0.1)
Immature Granulocytes: 0 % (ref 0–2)
Lymphocytes Absolute: 1.7 10*3/uL (ref 0.7–3.1)
Lymphs: 13 % — ABNORMAL LOW (ref 14–46)
MCH: 32.3 pg (ref 26.6–33.0)
MCHC: 34 g/dL (ref 31.5–35.7)
MCV: 95 fL (ref 79–97)
Monocytes Absolute: 0.9 10*3/uL (ref 0.1–0.9)
Monocytes: 7 % (ref 4–12)
Neutrophils Absolute: 10.7 10*3/uL — ABNORMAL HIGH (ref 1.4–7.0)
Neutrophils Relative %: 80 % — ABNORMAL HIGH (ref 40–74)
Platelets: 308 10*3/uL (ref 155–379)
RBC: 4.46 x10E6/uL (ref 4.14–5.80)
RDW: 13.9 % (ref 12.3–15.4)
WBC: 13.5 10*3/uL — ABNORMAL HIGH (ref 3.4–10.8)

## 2012-07-03 LAB — ANA W/REFLEX: Anti Nuclear Antibody(ANA): NEGATIVE

## 2012-07-03 LAB — AMMONIA: Ammonia: 54 ug/dL (ref 27–102)

## 2012-07-03 LAB — TSH: TSH: 2.08 u[IU]/mL (ref 0.450–4.500)

## 2012-07-03 LAB — RPR: RPR: NONREACTIVE

## 2012-07-03 LAB — HGB A1C W/O EAG: Hgb A1c MFr Bld: 5.7 % — ABNORMAL HIGH (ref 4.8–5.6)

## 2012-07-04 ENCOUNTER — Ambulatory Visit (HOSPITAL_COMMUNITY): Payer: Self-pay

## 2012-07-04 NOTE — Progress Notes (Signed)
Quick Note:  Please call patient and advise him that his labs looked fine For the most part. He did have an elevated white count which can indicate an infection. If he does not have any fevers or chills or upper respiratory infection or urinary symptoms and watchful waiting is indicated. If he does have upper respiratory symptoms or urinary complaints consistent with burning or cloudy urine that he may have a urine infection that could account for the changes in his white count. Otherwise his blood work indicates an increased risk for diabetes based on that the diabetes marker, hemoglobin A1c was borderline high at 5.7. This needs to be watched as well. No other action is required at this time. ______

## 2012-07-05 ENCOUNTER — Ambulatory Visit (HOSPITAL_COMMUNITY): Payer: Self-pay

## 2012-07-08 ENCOUNTER — Ambulatory Visit (HOSPITAL_COMMUNITY): Payer: Self-pay

## 2012-07-08 ENCOUNTER — Ambulatory Visit
Admission: RE | Admit: 2012-07-08 | Discharge: 2012-07-08 | Disposition: A | Discharge status: Home / Self Care | Payer: Medicare Other | Source: Ambulatory Visit | Attending: Neurology | Admitting: Neurology

## 2012-07-08 DIAGNOSIS — R413 Other amnesia: Secondary | ICD-10-CM

## 2012-07-08 DIAGNOSIS — F203 Undifferentiated schizophrenia: Secondary | ICD-10-CM

## 2012-07-08 DIAGNOSIS — F1910 Other psychoactive substance abuse, uncomplicated: Secondary | ICD-10-CM

## 2012-07-08 DIAGNOSIS — F17200 Nicotine dependence, unspecified, uncomplicated: Secondary | ICD-10-CM

## 2012-07-09 ENCOUNTER — Ambulatory Visit (HOSPITAL_COMMUNITY): Payer: Self-pay

## 2012-07-10 ENCOUNTER — Ambulatory Visit (HOSPITAL_COMMUNITY): Payer: Self-pay

## 2012-07-10 ENCOUNTER — Other Ambulatory Visit: Payer: Medicare Other

## 2012-07-11 ENCOUNTER — Ambulatory Visit (HOSPITAL_COMMUNITY): Payer: Self-pay

## 2012-07-12 ENCOUNTER — Ambulatory Visit (HOSPITAL_COMMUNITY): Payer: Self-pay

## 2012-07-13 NOTE — Progress Notes (Signed)
Quick Note:  Patients MRI shows no atrophy or other explanation for memory loss.  The small calcified lesion is not of concern. Please call patient with results. ______

## 2012-07-15 ENCOUNTER — Ambulatory Visit (HOSPITAL_COMMUNITY): Payer: Self-pay

## 2012-07-15 NOTE — Progress Notes (Signed)
Quick Note:  Spoke with patient's wife and relayed the results of his MRI. Mrs Cueva was also reminded of any future appointments; she understood and had no questions.  ______

## 2012-07-16 ENCOUNTER — Ambulatory Visit (INDEPENDENT_AMBULATORY_CARE_PROVIDER_SITE_OTHER): Payer: Medicare Other

## 2012-07-16 ENCOUNTER — Ambulatory Visit (HOSPITAL_COMMUNITY): Payer: Self-pay

## 2012-07-16 DIAGNOSIS — R413 Other amnesia: Secondary | ICD-10-CM

## 2012-07-16 DIAGNOSIS — F203 Undifferentiated schizophrenia: Secondary | ICD-10-CM

## 2012-07-16 DIAGNOSIS — F1910 Other psychoactive substance abuse, uncomplicated: Secondary | ICD-10-CM

## 2012-07-16 DIAGNOSIS — F17200 Nicotine dependence, unspecified, uncomplicated: Secondary | ICD-10-CM

## 2012-07-16 NOTE — Procedures (Signed)
  History:  Todd Winters is a 42 year old gentleman with a history of schizophrenia and a history of some memory issues. The patient is being evaluated for this problem.  This is a routine EEG. No skull defects are noted. Medications include amantadine, BuSpar, Celexa, Neurontin, Zyprexa, Prilosec, Lyrica, Inderal, trazodone, and vitamin B12.  EEG classification: Normal awake  Description of the recording: The background rhythms of this recording consists of a fairly well modulated medium amplitude alpha rhythm of 10 Hz that is reactive to eye opening and closure. As the record progresses, the patient appears to remain in the waking state throughout the recording. Photic stimulation was performed, resulting in a bilateral and symmetric photic driving response. Hyperventilation was also performed, resulting in a minimal buildup of the background rhythm activities without significant slowing seen. At no time during the recording does there appear to be evidence of spike or spike wave discharges or evidence of focal slowing. EKG monitor shows no evidence of cardiac rhythm abnormalities with a heart rate of 66.  Impression: This is a normal EEG recording in the waking state. No evidence of ictal or interictal discharges are seen.

## 2012-07-17 ENCOUNTER — Ambulatory Visit (HOSPITAL_COMMUNITY): Payer: Self-pay

## 2012-07-17 ENCOUNTER — Telehealth: Payer: Self-pay | Admitting: Neurology

## 2012-07-17 NOTE — Telephone Encounter (Signed)
Please relte the EMG results  As Dr Anne Hahn wrote directly to patient, Dr Frances Furbish is currently not in the office.

## 2012-07-18 ENCOUNTER — Ambulatory Visit (HOSPITAL_COMMUNITY): Payer: Self-pay

## 2012-07-18 NOTE — Telephone Encounter (Signed)
Please call patient and close this encounter.

## 2012-07-19 NOTE — Telephone Encounter (Signed)
Spoke with patient and relayed results of EEG.  Patient understood and had no questions. 

## 2012-07-22 NOTE — Progress Notes (Signed)
Quick Note:  Please call and advise the patient that the EEG or brain wave test we performed was reported as normal in the awake state. We checked for changes in the electrical brain activity and it was normal. No further action is required on this test at this time. Please remind patient to keep any upcoming appointments or tests and to call us with any interim questions, concerns, problems or updates. Thanks,  Huston Foley, MD, PhD    ______

## 2012-07-26 NOTE — Progress Notes (Signed)
Quick Note:  Spoke with patient and relayed the results of their EEG as well as Dr Teofilo Pod advise or recommendations. The patient was also reminded of any future appointments. The patient understood and had no questions.  ______

## 2012-08-20 DIAGNOSIS — R413 Other amnesia: Secondary | ICD-10-CM

## 2012-08-20 DIAGNOSIS — F209 Schizophrenia, unspecified: Secondary | ICD-10-CM

## 2012-08-27 ENCOUNTER — Telehealth: Payer: Self-pay | Admitting: Neurology

## 2012-08-27 DIAGNOSIS — F209 Schizophrenia, unspecified: Secondary | ICD-10-CM

## 2012-08-27 DIAGNOSIS — R413 Other amnesia: Secondary | ICD-10-CM

## 2012-08-27 NOTE — Telephone Encounter (Signed)
Pt is on schedule for Dr. Frances Furbish on 10/02/12 but needs to be r/s due to changes on Dr. Teofilo Pod schedule. Pt needs to get in per notes a month f/u but I don't have anything available until Dec 4th. Can someone call pt to r/s with a closer apt within this date. Thanks

## 2012-08-28 NOTE — Telephone Encounter (Signed)
Todd Winters please call the patient and schedule if you can.

## 2012-08-29 NOTE — Telephone Encounter (Signed)
Spoke with pt's wife and confirmed f/u appointment with Dr Frances Furbish on 09/24/2012 @ 12:00 noon.

## 2012-09-17 ENCOUNTER — Emergency Department (HOSPITAL_COMMUNITY)
Admission: EM | Admit: 2012-09-17 | Discharge: 2012-09-18 | Disposition: A | Discharge status: Home / Self Care | Payer: Medicare Other | Attending: Emergency Medicine | Admitting: Emergency Medicine

## 2012-09-17 DIAGNOSIS — Z79899 Other long term (current) drug therapy: Secondary | ICD-10-CM | POA: Insufficient documentation

## 2012-09-17 DIAGNOSIS — G589 Mononeuropathy, unspecified: Secondary | ICD-10-CM | POA: Insufficient documentation

## 2012-09-17 DIAGNOSIS — K0889 Other specified disorders of teeth and supporting structures: Secondary | ICD-10-CM

## 2012-09-17 DIAGNOSIS — F200 Paranoid schizophrenia: Secondary | ICD-10-CM | POA: Insufficient documentation

## 2012-09-17 DIAGNOSIS — E538 Deficiency of other specified B group vitamins: Secondary | ICD-10-CM | POA: Insufficient documentation

## 2012-09-17 DIAGNOSIS — F17200 Nicotine dependence, unspecified, uncomplicated: Secondary | ICD-10-CM | POA: Insufficient documentation

## 2012-09-17 DIAGNOSIS — K029 Dental caries, unspecified: Secondary | ICD-10-CM | POA: Insufficient documentation

## 2012-09-17 DIAGNOSIS — K089 Disorder of teeth and supporting structures, unspecified: Secondary | ICD-10-CM | POA: Insufficient documentation

## 2012-09-17 NOTE — ED Provider Notes (Signed)
CSN: [id number]     Arrival date & time 09/17/12  2336 History  This chart was scribed for non-physician practitioner Earley Favor, FNP, working with Gilda Crease, by Yevette Edwards, ED Scribe. This patient was seen in room WTR6/WTR6 and the patient's care was started at 11:56 PM.   First MD Initiated Contact with Patient 09/17/12 2353     Chief Complaint  Patient presents with  . Dental Pain    The history is provided by the patient. No language interpreter was used.   HPI Comments: Todd Barretta Sr. is a 42 y.o. male who presents to the Emergency Department complaining of gradually-increasing dental pain. The pt states that the two affected teeth have been troublesome for approximately a year, but the pain has increased in the past four days. He states the pain is "killing him." He has attempted to treat his pain with IBU, but with no resolution.  Has been seen X2 at [facility] ED and on 2 courses of antibiotics but not given and pain contol  He does not have a dentist.  Past Medical History  Diagnosis Date  . Paranoid schizophrenia   . Neuropathy   . Vitamin B 12 deficiency    No past surgical history on file. Family History  Problem Relation Age of Onset  . Adopted: Yes   History  Substance Use Topics  . Smoking status: Current Every Day Smoker -- 2.00 packs/day  . Smokeless tobacco: Not on file  . Alcohol Use: No     Comment: HIstory of cocaine abuse    Review of Systems  HENT: Positive for dental problem.   All other systems reviewed and are negative.    Allergies  Review of patient's allergies indicates no known allergies.  Home Medications   Current Outpatient Rx  Name  Route  Sig  Dispense  Refill  . Amantadine HCl 100 MG tablet               . busPIRone (BUSPAR) 10 MG tablet   Oral   Take 10 mg by mouth 3 (three) times daily.         . citalopram (CELEXA) 20 MG tablet   Oral   Take 20 mg by mouth at bedtime.         . gabapentin  (NEURONTIN) 100 MG capsule   Oral   Take 100 mg by mouth 3 (three) times daily.         Marland Kitchen OLANZapine (ZYPREXA) 10 MG tablet   Oral   Take 10 mg by mouth at bedtime.         Marland Kitchen omeprazole (PRILOSEC) 20 MG capsule   Oral   Take 20 mg by mouth daily.         . Paliperidone Palmitate 117 MG/0.75ML SUSP   Intramuscular   Inject 1 each into the muscle every 30 (thirty) days.         . pregabalin (LYRICA) 50 MG capsule   Oral   Take 50 mg by mouth daily.         . propranolol (INDERAL) 10 MG tablet   Oral   Take 10 mg by mouth 2 (two) times daily.         . traMADol (ULTRAM) 50 MG tablet   Oral   Take 1 tablet (50 mg total) by mouth every 8 (eight) hours as needed for pain.   18 tablet   0   . traZODone (DESYREL) 100 MG tablet  Oral   Take 100 mg by mouth as needed.         . vitamin B-12 (CYANOCOBALAMIN) 1000 MCG tablet   Oral   Take 1,000 mcg by mouth daily.          Triage Vitals: BP 118/80  Pulse 84  Temp(Src) 98.2 F (36.8 C) (Oral)  SpO2 96%  Physical Exam  Nursing note and vitals reviewed. Constitutional: He is oriented to person, place, and time. He appears well-developed and well-nourished. No distress.  HENT:  Head: Normocephalic and atraumatic.  Right Ear: External ear normal.  Left Ear: External ear normal.  Severe dental caries. Left lower canine. 1st, 2nd, 3rd molar. Minimal gums. Erythema. No cervical adenopathy.   Eyes: EOM are normal.  Neck: Neck supple. No tracheal deviation present.  Cardiovascular: Normal rate.   Pulmonary/Chest: Effort normal. No respiratory distress.  Musculoskeletal: Normal range of motion.  Lymphadenopathy:    He has no cervical adenopathy.  Neurological: He is alert and oriented to person, place, and time.  Skin: Skin is warm and dry.  Psychiatric: He has a normal mood and affect. His behavior is normal.    ED Course   DIAGNOSTIC STUDIES:  Oxygen Saturation is 96% on room air, normal by my  interpretation.    COORDINATION OF CARE:  11:56 PM-Discussed treatment plan with patient, and the patient agreed to the plan.   Procedures (including critical care time)  Labs Reviewed - No data to display No results found. 1. Pain, dental     MDM  Instructed patient to continue taking his Flagyl and add Ultram for pain control, and given him a control for instructing him to call first thing in the morning to set an appointment time and that they were referred through the emergency department  I personally performed the services described in this documentation, which was scribed in my presence. The recorded information has been reviewed and is accurate.     Arman Filter, NP 09/18/12 [phone]

## 2012-09-17 NOTE — ED Notes (Signed)
Pt states he has had several teeth on the bottom L side have been causing him pain x 4 days.

## 2012-09-18 MED ORDER — TRAMADOL HCL 50 MG PO TABS
50.0000 mg | ORAL_TABLET | Freq: Once | ORAL | Status: AC
  Administered 2012-09-18: 50 mg via ORAL
  Filled 2012-09-18: qty 1

## 2012-09-18 MED ORDER — TRAMADOL HCL 50 MG PO TABS: 50.0000 mg | ORAL_TABLET | Freq: Three times a day (TID) | ORAL | Status: AC | PRN

## 2012-09-18 NOTE — ED Provider Notes (Signed)
Medical screening examination/treatment/procedure(s) were performed by non-physician practitioner and as supervising physician I was immediately available for consultation/collaboration.   Gilda Crease, MD 09/18/12 [phone]

## 2012-09-24 ENCOUNTER — Ambulatory Visit: Payer: Self-pay | Admitting: Neurology

## 2012-10-02 ENCOUNTER — Ambulatory Visit: Payer: Medicare Other | Admitting: Neurology

## 2012-10-17 ENCOUNTER — Encounter: Payer: Self-pay | Admitting: Neurology

## 2012-10-17 ENCOUNTER — Ambulatory Visit (INDEPENDENT_AMBULATORY_CARE_PROVIDER_SITE_OTHER): Payer: Medicare Other | Admitting: Neurology

## 2012-10-17 VITALS — BP 102/69 | HR 68 | Temp 97.7°F | Ht 72.0 in | Wt 189.0 lb

## 2012-10-17 DIAGNOSIS — F17200 Nicotine dependence, unspecified, uncomplicated: Secondary | ICD-10-CM

## 2012-10-17 DIAGNOSIS — F1910 Other psychoactive substance abuse, uncomplicated: Secondary | ICD-10-CM

## 2012-10-17 DIAGNOSIS — F203 Undifferentiated schizophrenia: Secondary | ICD-10-CM

## 2012-10-17 DIAGNOSIS — F1911 Other psychoactive substance abuse, in remission: Secondary | ICD-10-CM

## 2012-10-17 DIAGNOSIS — R413 Other amnesia: Secondary | ICD-10-CM

## 2012-10-17 DIAGNOSIS — F209 Schizophrenia, unspecified: Secondary | ICD-10-CM

## 2012-10-17 NOTE — Patient Instructions (Signed)
I do not believe that you have true dementia at this time. I think your memory loss is most likely a function of your underlying psychiatric illness but also confounded by being on multiple psychotropic medications. Your recent cognitive testing with the neuropsychologist is in keeping with cognitive difficulties primarily related to your psychiatric illness and your pattern of insufficient learning rather than forgetting of learned information would argue against a progressive cortical dementia diagnosis. I think at this time I can see you back on an as-needed basis. Should your symptoms progressively get worse over time we can see you back in clinic and also consider a repeat neurocognitive evaluation. Your MRI showed no acute findings and no evidence of shrinkage which we call atrophy. Your brain wave test, EEG, was reported as normal. Your blood work was for the most part normal as well.

## 2012-10-17 NOTE — Progress Notes (Signed)
Subjective:    Patient ID: Todd Ludington Sr. is a 42 y.o. male.  HPI  Interim history:   Todd Winters is a 42 year old gentleman with an underlying medical history of vitamin B12 deficiency, schizophrenia, substance abuse, and peripheral neuropathy, s/p recent hospitalization for exacerbation of Todd schizophrenia, who presents for FU consultation of Todd memory loss. He is unaccompanied today. I first met him and Todd Winters on 07/02/2012, at which time they reported that Todd memory problems dated back to 2011. He has a history of heavy alcohol use and has a history of crack cocaine use. He quit drinking alcohol in May 2014, and quit using crack cocaine in December 2012 but he continues to smoke one to one and a half packs of cigarettes per day. At the time of Todd appointment in June I suggested a brain MRI, blood work, EEG and referral for cognitive testing. There is no FHx of dementia. There is family Hx of psychiatric illness.  Todd brain MRI without contrast from 07/07/2012 was reviewed: Focal calcification (4mm) in the right frontal region, possibly in the subarachnoid space based on coronal views (series 9, image 22). Mucosal thickening in the maxillary sinuses. No acute findings.  He had an EEG on 07/16/2012 which was reported as normal in the awake state.  Laboratory values showed normal TSH, normal ammonia, negative ANA, mild increase in white count and borderline increased hemoglobin A1c. He had neuropsychological testing under Dr. Leonides Winters on 7/22 and 07/2912 and I reviewed those test results as well: Neuropsychological testing indicated impairments with in multiple cognitive domains. Todd new learning and immediate memory were relative weaknesses within the moderate to severely impaired range, though Todd retention of initially learned information was normal. In conclusion, it is most likely that Todd cognitive difficulties are primarily related to psychiatric illness. Todd pattern on neuropsychological  testing of an inefficient learning rather than forgetting of recently learned information would argue against a progressive cortical dementia. It is well established that persons with schizophrenia can experience problems with attention span, distractibility, learning, planning and organization.  Todd current medications are BuSpar, Celexa, Neurontin, Zyprexa, Prilosec, Lyrica, trazodone, B12, Invega inj. he has no new complaints. He feels about the same.  Todd Past Medical History Is Significant For: Past Medical History  Diagnosis Date  . Paranoid schizophrenia   . Neuropathy   . Vitamin B 12 deficiency     Todd Past Surgical History Is Significant For: No past surgical history on file.  Todd Family History Is Significant For: Family History  Problem Relation Age of Onset  . Adopted: Yes    Todd Social History Is Significant For: History   Social History  . Marital Status: Married    Spouse Name: N/A    Number of Children: N/A  . Years of Education: N/A   Social History Main Topics  . Smoking status: Current Every Day Smoker -- 2.00 packs/day  . Smokeless tobacco: None  . Alcohol Use: No     Comment: HIstory of cocaine abuse  . Drug Use: No     Comment: history of cocane abuse  . Sexual Activity: None   Other Topics Concern  . None   Social History Narrative  . None    Todd Allergies Are:  No Known Allergies:   Todd Current Medications Are:  Outpatient Encounter Prescriptions as of 10/17/2012  Medication Sig Dispense Refill  . busPIRone (BUSPAR) 10 MG tablet Take 10 mg by mouth 3 (three) times daily.      Marland Kitchen  citalopram (CELEXA) 20 MG tablet Take 20 mg by mouth at bedtime.      . gabapentin (NEURONTIN) 100 MG capsule Take 100 mg by mouth 3 (three) times daily.      Marland Kitchen OLANZapine (ZYPREXA) 10 MG tablet Take 10 mg by mouth at bedtime.      Marland Kitchen omeprazole (PRILOSEC) 20 MG capsule Take 20 mg by mouth daily.      . Paliperidone Palmitate 117 MG/0.75ML SUSP Inject 1 each into  the muscle every 30 (thirty) days.      . pregabalin (LYRICA) 50 MG capsule Take 50 mg by mouth daily.      . propranolol (INDERAL) 10 MG tablet Take 10 mg by mouth 2 (two) times daily.      . traMADol (ULTRAM) 50 MG tablet Take 1 tablet (50 mg total) by mouth every 8 (eight) hours as needed for pain.  18 tablet  0  . vitamin B-12 (CYANOCOBALAMIN) 1000 MCG tablet Take 1,000 mcg by mouth daily.      . traZODone (DESYREL) 100 MG tablet Take 100 mg by mouth as needed.      . [DISCONTINUED] Amantadine HCl 100 MG tablet        No facility-administered encounter medications on file as of 10/17/2012.  : Review of Systems  Constitutional:       Weight gain  Respiratory: Positive for shortness of breath.   Gastrointestinal:       Hemorrhoids  Neurological: Positive for dizziness.  Psychiatric/Behavioral:       Too much sleep Decreased energy    Objective:  Neurologic Exam  Physical Exam Physical Examination:   Filed Vitals:   10/17/12 1455  BP: 102/69  Pulse: 68  Temp: 97.7 F (36.5 C)    General Examination: The patient is a very pleasant 42 y.o. male in no acute distress. He appears well-developed and adequately groomed. There is overall mild psychomotor retardation.  HEENT: Normocephalic, atraumatic, pupils are equal, round and reactive to light and accommodation. Extraocular tracking is good without limitation to gaze excursion or nystagmus noted. Normal smooth pursuit is noted. Hearing is grossly intact. Face is symmetric with normal facial animation and normal facial sensation. Speech is clear with no dysarthria noted. There is no hypophonia. There is no lip, neck/head, jaw or voice tremor. Neck is supple with full range of passive and active motion. There are no carotid bruits on auscultation. Oropharynx exam reveals: mild mouth dryness, poor dental hygiene and mild airway crowding, due to elongated uvula. Mallampati is class I. Tongue protrudes centrally and palate elevates  symmetrically.   Chest: Clear to auscultation without wheezing, rhonchi or crackles noted.  Heart: S1+S2+0, regular and normal without murmurs, rubs or gallops noted.   Abdomen: Soft, non-tender and non-distended with normal bowel sounds appreciated on auscultation.  Extremities: There is no pitting edema in the distal lower extremities bilaterally. Pedal pulses are intact.  Skin: Warm and dry without trophic changes noted. There are no varicose veins.  Musculoskeletal: exam reveals no obvious joint deformities, tenderness or joint swelling or erythema.   Neurologically:  Mental status: The patient is awake, alert and oriented in all 4 spheres. Todd memory, attention, language and knowledge are mildly impaired. MMSE was 26/30 in June and I did not repeat testing today. AFT was 11, CDT was 4/4. There is no aphasia, agnosia, apraxia or anomia. There is poor eye contact. Speech is clear with normal enunciation, but some monotony and hypophonia noted. Thought process is linear, he  does not need redirection. Mood is mildly depressed and affect is flat.  Cranial nerves are as described above under HEENT exam. In addition, shoulder shrug is normal with equal shoulder height noted. Motor exam: Normal bulk, strength and tone is noted. There is no drift, tremor or rebound. Romberg is negative. Reflexes are 2+ throughout. Fine motor skills are intact with normal finger taps, normal hand movements, normal rapid alternating patting, normal foot taps and normal foot agility.  Cerebellar testing shows no dysmetria or intention tremor on finger to nose testing. There is no truncal or gait ataxia.  Sensory exam is intact to light touch.   Gait, station and balance are unremarkable. No veering to one side is noted. No leaning to one side is noted. Posture is age-appropriate to mildly stooped for age and stance is narrow based. No problems turning are noted. He turns en bloc. Tandem walk is difficult. Intact toe and  heel stance is noted.               Assessment and Plan:   In summary, Todd Winters is a 42 y.o.-year old male with a history of prior polysubstance abuse, schizophrenia and prior diagnosis of fetal alcohol syndrome, current daily smoker, who has been experiencing memory loss since 2011. Todd exam shows unchanged findings. In June, Todd MMSE was 26/30, clock drawing was 4/4 and category fluency was 5 (reduced for age). Todd exam is noteworthy for psychomotor retardation and I do not believe Todd exam is c/w dementia. Todd w/u included a brain MRI which showed no acute findings and no evidence of atrophy, blood work which showed no significant abnormality and a normal awake EEG. Recent neuropsychological testing showed findings consistent with cognitive difficulties related to Todd psychiatric illness rather than findings in keeping with progressive dementia. I explained all Todd test results to him in detail today. I suggested that he continue followup with Todd primary care physician and Todd psychiatrist. I do not believe any to see him on a regular basis but would be happy to see him back if the need arises. We can also consider repeat neurocognitive testing down the road. I asked him to stop smoking. He demonstrated understanding and was agreeable with the plan.

## 2012-12-05 ENCOUNTER — Other Ambulatory Visit: Payer: Self-pay

## 2015-11-01 ENCOUNTER — Observation Stay (HOSPITAL_COMMUNITY)
Admission: EM | Admit: 2015-11-01 | Discharge: 2015-11-03 | Disposition: A | Discharge status: Home Health | Payer: Medicare Other | Attending: Family Medicine | Admitting: Family Medicine

## 2015-11-01 ENCOUNTER — Encounter (HOSPITAL_COMMUNITY): Payer: Self-pay

## 2015-11-01 ENCOUNTER — Emergency Department (HOSPITAL_COMMUNITY): Payer: Medicare Other

## 2015-11-01 DIAGNOSIS — R4182 Altered mental status, unspecified: Secondary | ICD-10-CM | POA: Diagnosis present

## 2015-11-01 DIAGNOSIS — F200 Paranoid schizophrenia: Secondary | ICD-10-CM | POA: Diagnosis not present

## 2015-11-01 DIAGNOSIS — R0789 Other chest pain: Secondary | ICD-10-CM | POA: Insufficient documentation

## 2015-11-01 DIAGNOSIS — G629 Polyneuropathy, unspecified: Secondary | ICD-10-CM | POA: Insufficient documentation

## 2015-11-01 DIAGNOSIS — F1910 Other psychoactive substance abuse, uncomplicated: Secondary | ICD-10-CM | POA: Diagnosis present

## 2015-11-01 DIAGNOSIS — E538 Deficiency of other specified B group vitamins: Secondary | ICD-10-CM | POA: Insufficient documentation

## 2015-11-01 DIAGNOSIS — F17200 Nicotine dependence, unspecified, uncomplicated: Secondary | ICD-10-CM | POA: Diagnosis not present

## 2015-11-01 DIAGNOSIS — F4323 Adjustment disorder with mixed anxiety and depressed mood: Secondary | ICD-10-CM | POA: Diagnosis present

## 2015-11-01 DIAGNOSIS — G252 Other specified forms of tremor: Secondary | ICD-10-CM | POA: Diagnosis present

## 2015-11-01 DIAGNOSIS — Z79899 Other long term (current) drug therapy: Secondary | ICD-10-CM | POA: Insufficient documentation

## 2015-11-01 DIAGNOSIS — R251 Tremor, unspecified: Secondary | ICD-10-CM | POA: Insufficient documentation

## 2015-11-01 DIAGNOSIS — G20 Parkinson's disease: Principal | ICD-10-CM | POA: Insufficient documentation

## 2015-11-01 DIAGNOSIS — K219 Gastro-esophageal reflux disease without esophagitis: Secondary | ICD-10-CM | POA: Insufficient documentation

## 2015-11-01 DIAGNOSIS — R531 Weakness: Secondary | ICD-10-CM | POA: Diagnosis not present

## 2015-11-01 DIAGNOSIS — R259 Unspecified abnormal involuntary movements: Secondary | ICD-10-CM

## 2015-11-01 DIAGNOSIS — F411 Generalized anxiety disorder: Secondary | ICD-10-CM | POA: Insufficient documentation

## 2015-11-01 DIAGNOSIS — E785 Hyperlipidemia, unspecified: Secondary | ICD-10-CM | POA: Insufficient documentation

## 2015-11-01 DIAGNOSIS — R269 Unspecified abnormalities of gait and mobility: Secondary | ICD-10-CM | POA: Diagnosis not present

## 2015-11-01 DIAGNOSIS — D539 Nutritional anemia, unspecified: Secondary | ICD-10-CM | POA: Insufficient documentation

## 2015-11-01 DIAGNOSIS — G4700 Insomnia, unspecified: Secondary | ICD-10-CM | POA: Insufficient documentation

## 2015-11-01 HISTORY — DX: Other psychoactive substance abuse, uncomplicated: F19.10

## 2015-11-01 HISTORY — DX: Alcohol abuse, in remission: F10.11

## 2015-11-01 LAB — COMPREHENSIVE METABOLIC PANEL
ALT: 14 U/L — ABNORMAL LOW (ref 17–63)
AST: 17 U/L (ref 15–41)
Albumin: 4.5 g/dL (ref 3.5–5.0)
Alkaline Phosphatase: 48 U/L (ref 38–126)
Anion gap: 8 (ref 5–15)
BUN: 9 mg/dL (ref 6–20)
CO2: 26 mmol/L (ref 22–32)
Calcium: 9.3 mg/dL (ref 8.9–10.3)
Chloride: 107 mmol/L (ref 101–111)
Creatinine, Ser: 0.99 mg/dL (ref 0.61–1.24)
GFR calc Af Amer: >60 mL/min (ref >60)
GFR calc non Af Amer: >60 mL/min (ref >60)
Glucose, Bld: 93 mg/dL (ref 65–99)
Potassium: 3.9 mmol/L (ref 3.5–5.1)
Sodium: 141 mmol/L (ref 135–145)
Total Bilirubin: 0.5 mg/dL (ref 0.3–1.2)
Total Protein: 7.4 g/dL (ref 6.5–8.1)

## 2015-11-01 LAB — I-STAT CHEM 8, ED
BUN: 11 mg/dL (ref 6–20)
Calcium, Ion: 1.08 mmol/L — ABNORMAL LOW (ref 1.15–1.40)
Chloride: 104 mmol/L (ref 101–111)
Creatinine, Ser: 1 mg/dL (ref 0.61–1.24)
Glucose, Bld: 92 mg/dL (ref 65–99)
HCT: 42 % (ref 39.0–52.0)
Hemoglobin: 14.3 g/dL (ref 13.0–17.0)
Potassium: 3.8 mmol/L (ref 3.5–5.1)
Sodium: 143 mmol/L (ref 135–145)
TCO2: 27 mmol/L (ref 0–100)

## 2015-11-01 LAB — DIFFERENTIAL
Basophils Absolute: 0 10*3/uL (ref 0.0–0.1)
Basophils Relative: 0 %
Eosinophils Absolute: 0 10*3/uL (ref 0.0–0.7)
Eosinophils Relative: 1 %
Lymphocytes Relative: 29 %
Lymphs Abs: 2.1 10*3/uL (ref 0.7–4.0)
Monocytes Absolute: 0.5 10*3/uL (ref 0.1–1.0)
Monocytes Relative: 6 %
Neutro Abs: 4.7 10*3/uL (ref 1.7–7.7)
Neutrophils Relative %: 64 %

## 2015-11-01 LAB — APTT: aPTT: 30 seconds (ref 24–36)

## 2015-11-01 LAB — CBC
HCT: 42 % (ref 39.0–52.0)
Hemoglobin: 13.9 g/dL (ref 13.0–17.0)
MCH: 33.9 pg (ref 26.0–34.0)
MCHC: 33.1 g/dL (ref 30.0–36.0)
MCV: 102.4 fL — ABNORMAL HIGH (ref 78.0–100.0)
Platelets: 276 10*3/uL (ref 150–400)
RBC: 4.1 MIL/uL — ABNORMAL LOW (ref 4.22–5.81)
RDW: 13 % (ref 11.5–15.5)
WBC: 7.4 10*3/uL (ref 4.0–10.5)

## 2015-11-01 LAB — PROTIME-INR
INR: 0.99
Prothrombin Time: 13.1 seconds (ref 11.4–15.2)

## 2015-11-01 LAB — CBG MONITORING, ED: Glucose-Capillary: 86 mg/dL (ref 65–99)

## 2015-11-01 LAB — I-STAT TROPONIN, ED: Troponin i, poc: 0 ng/mL (ref 0.00–0.08)

## 2015-11-01 NOTE — ED Triage Notes (Addendum)
Pt.s wife reports that pt. Is having difficulty with daily living skills. Pt. Is having difficulty with remembering .   Pt. Went to his psychiatrist and he was sent to us for mental health evaluation and r/o any neurological changes. Pt. Denies any homicidal or suicidal thoughts.  Pt. 's wife also reports that he is having problems walking/ shuffling of his feet.  Pt. Denies any n/v/d .  Pt. Denies any pain. Skin is warm and dry.  Pt. s affect is flat. Pt. Also has slurred speech and is having difficulty with finding words.

## 2015-11-01 NOTE — ED Notes (Signed)
MD at bedside. 

## 2015-11-01 NOTE — ED Provider Notes (Signed)
[facility]-EMERGENCY DEPT Provider Note   CSN: [id number] Arrival date & time: 10/2161096045/[id number]  1559     History   Chief Complaint Chief Complaint  Patient presents with  . Altered Mental Status    HPI Todd Saneaul Foronda Sr. is a 45 y.o. male.  Patient with a history of paranoid schizophrenia, vitamin B12 deficiency presents with wife who is concerned with a progressive mental status change in the patient over the last 2 months described as a decreased ability to participate in daily living (ie bathing son, showering, feeding himself); word finding is more difficult affecting his ability to communicate per his usual; his walk has become a shuffling type gait without loss of balance or falls; increased agitation and episodes of physical restlessness and upper extremity tremor. He is taking [place] every month on the 1st and wife reports that he can have a more flat affect prior to injection but states current symptoms are significantly abnormal for him. No fevers, cough, SOB, vomiting, diarrhea. Wife denies any recent medication changes or dosing changes. She has been in contact with his psychiatrist who advised coming to the emergency department for a full evaluation, including psychiatric.    The history is provided by the patient and the spouse. No language interpreter was used.    Past Medical History:  Diagnosis Date  . Neuropathy (HCC)   . Paranoid schizophrenia (HCC)   . Vitamin B 12 deficiency     Patient Active Problem List   Diagnosis Date Noted  . GERD 01/03/2010  . COUGH 01/03/2010  . FATIGUE 09/28/2009  . ABDOMINAL PAIN, EPIGASTRIC 07/15/2009  . SUPRAPUBIC PAIN 07/15/2009  . DENTAL CARIES 05/12/2009  . TOBACCO ABUSE 06/15/2008  . SINUSITIS 06/15/2008  . PARANOID SCHIZOPHRENIA 04/23/2008  . OTITIS EXTERNA 04/26/2007  . KNEE PAIN, BILATERAL 12/21/2006  . ABUSE, ALCOHOL, EPISODIC 10/10/2006  . DEPRESSION 10/10/2006  . PERIPHERAL NEUROPATHY, LOWER EXTREMITIES, BILATERAL 10/10/2006    . VITAMIN B12 DEFICIENCY 07/17/2006    History reviewed. No pertinent surgical history.     Home Medications    Prior to Admission medications   Medication Sig Start Date End Date Taking? Authorizing Provider  busPIRone (BUSPAR) 10 MG tablet Take 10 mg by mouth 3 (three) times daily. 06/18/12   Historical Provider, MD  citalopram (CELEXA) 20 MG tablet Take 20 mg by mouth at bedtime. 06/18/12   Historical Provider, MD  gabapentin (NEURONTIN) 100 MG capsule Take 100 mg by mouth 3 (three) times daily. 06/18/12   Historical Provider, MD  OLANZapine (ZYPREXA) 10 MG tablet Take 10 mg by mouth at bedtime. 06/18/12   Historical Provider, MD  omeprazole (PRILOSEC) 20 MG capsule Take 20 mg by mouth daily.    Historical Provider, MD  Paliperidone Palmitate 117 MG/0.75ML SUSP Inject 1 each into the muscle every 30 (thirty) days.    Historical Provider, MD  pregabalin (LYRICA) 50 MG capsule Take 50 mg by mouth daily.    Historical Provider, MD  propranolol (INDERAL) 10 MG tablet Take 10 mg by mouth 2 (two) times daily. 06/15/12   Historical Provider, MD  traMADol (ULTRAM) 50 MG tablet Take 1 tablet (50 mg total) by mouth every 8 (eight) hours as needed for pain. 09/18/12   Earley FavorGail Schulz, NP  traZODone (DESYREL) 100 MG tablet Take 100 mg by mouth as needed. 06/18/12   Historical Provider, MD  vitamin B-12 (CYANOCOBALAMIN) 1000 MCG tablet Take 1,000 mcg by mouth daily.    Historical Provider, MD    Family History Family  History  Problem Relation Age of Onset  . Adopted: Yes    Social History Social History  Substance Use Topics  . Smoking status: Current Every Day Smoker    Packs/day: 2.00  . Smokeless tobacco: Never Used  . Alcohol use No     Comment: HIstory of cocaine abuse     Allergies   Review of patient's allergies indicates no known allergies.   Review of Systems Review of Systems  Unable to perform ROS: Mental status change     Physical Exam Updated Vital Signs BP 119/80    Pulse 70   Temp 97.9 F (36.6 C) (Oral)   Resp 14   Ht 6' (1.829 m)   Wt 82.7 kg   SpO2 100%   BMI 24.73 kg/m   Physical Exam  Constitutional: He appears well-developed and well-nourished. No distress.  HENT:  Head: Normocephalic.  Eyes: Conjunctivae are normal. Pupils are equal, round, and reactive to light.  Neck: Normal range of motion. Neck supple.  Cardiovascular: Normal rate.   No murmur heard. Pulmonary/Chest: Effort normal. He has no wheezes. He has no rales.  Abdominal: Soft. He exhibits no distension. There is no tenderness.  Musculoskeletal: Normal range of motion.  Neurological: He is alert. He has normal strength.  The patient does not follow command. He is awake, tracking. He has an upper extremity intention tremor. His speech is slowed, verbal communication is minimal.   Nursing note and vitals reviewed.    ED Treatments / Results  Labs (all labs ordered are listed, but only abnormal results are displayed) Labs Reviewed  CBC - Abnormal; Notable for the following:       Result Value   RBC 4.10 (*)    MCV 102.4 (*)    All other components within normal limits  COMPREHENSIVE METABOLIC PANEL - Abnormal; Notable for the following:    ALT 14 (*)    All other components within normal limits  I-STAT CHEM 8, ED - Abnormal; Notable for the following:    Calcium, Ion 1.08 (*)    All other components within normal limits  PROTIME-INR  APTT  DIFFERENTIAL  I-STAT TROPOININ, ED  CBG MONITORING, ED   Results for orders placed or performed during the hospital encounter of 11/01/15  Protime-INR  Result Value Ref Range   Prothrombin Time 13.1 11.4 - 15.2 seconds   INR 0.99   APTT  Result Value Ref Range   aPTT 30 24 - 36 seconds  CBC  Result Value Ref Range   WBC 7.4 4.0 - 10.5 K/uL   RBC 4.10 (L) 4.22 - 5.81 MIL/uL   Hemoglobin 13.9 13.0 - 17.0 g/dL   HCT 96.0 45.4 - 09.8 %   MCV 102.4 (H) 78.0 - 100.0 fL   MCH 33.9 26.0 - 34.0 pg   MCHC 33.1 30.0 - 36.0  g/dL   RDW 11.9 14.7 - 82.9 %   Platelets 276 150 - 400 K/uL  Differential  Result Value Ref Range   Neutrophils Relative % 64 %   Neutro Abs 4.7 1.7 - 7.7 K/uL   Lymphocytes Relative 29 %   Lymphs Abs 2.1 0.7 - 4.0 K/uL   Monocytes Relative 6 %   Monocytes Absolute 0.5 0.1 - 1.0 K/uL   Eosinophils Relative 1 %   Eosinophils Absolute 0.0 0.0 - 0.7 K/uL   Basophils Relative 0 %   Basophils Absolute 0.0 0.0 - 0.1 K/uL  Comprehensive metabolic panel  Result Value Ref Range  Sodium 141 135 - 145 mmol/L   Potassium 3.9 3.5 - 5.1 mmol/L   Chloride 107 101 - 111 mmol/L   CO2 26 22 - 32 mmol/L   Glucose, Bld 93 65 - 99 mg/dL   BUN 9 6 - 20 mg/dL   Creatinine, Ser 1.61 0.61 - 1.24 mg/dL   Calcium 9.3 8.9 - 09.6 mg/dL   Total Protein 7.4 6.5 - 8.1 g/dL   Albumin 4.5 3.5 - 5.0 g/dL   AST 17 15 - 41 U/L   ALT 14 (L) 17 - 63 U/L   Alkaline Phosphatase 48 38 - 126 U/L   Total Bilirubin 0.5 0.3 - 1.2 mg/dL   GFR calc non Af Amer >60 >60 mL/min   GFR calc Af Amer >60 >60 mL/min   Anion gap 8 5 - 15  I-stat troponin, ED  Result Value Ref Range   Troponin i, poc 0.00 0.00 - 0.08 ng/mL   Comment 3          CBG monitoring, ED  Result Value Ref Range   Glucose-Capillary 86 65 - 99 mg/dL  I-Stat Chem 8, ED  Result Value Ref Range   Sodium 143 135 - 145 mmol/L   Potassium 3.8 3.5 - 5.1 mmol/L   Chloride 104 101 - 111 mmol/L   BUN 11 6 - 20 mg/dL   Creatinine, Ser 0.45 0.61 - 1.24 mg/dL   Glucose, Bld 92 65 - 99 mg/dL   Calcium, Ion 4.09 (L) 1.15 - 1.40 mmol/L   TCO2 27 0 - 100 mmol/L   Hemoglobin 14.3 13.0 - 17.0 g/dL   HCT 81.1 91.4 - 78.2 %   Ct Head Wo Contrast  Result Date: 11/01/2015 CLINICAL DATA:  Altered mental status. EXAM: CT HEAD WITHOUT CONTRAST TECHNIQUE: Contiguous axial images were obtained from the base of the skull through the vertex without intravenous contrast. COMPARISON:  Head CT 07/07/2007 and MRI brain 07/08/2012 FINDINGS: Brain: The ventricles are normal in  size and configuration. No extra-axial fluid collections are identified. The gray-white differentiation is normal. No CT findings for acute intracranial process such as hemorrhage or infarction. No mass lesions. Stable calcification in a right frontal sulcus. The brainstem and cerebellum are grossly normal. Vascular: No vascular calcifications, aneurysm or hyperdense vessels. Skull: No skull fracture or bone lesions. Sinuses/Orbits: Stable large mucous retention cysts or polyps in both maxillary sinuses. Concha bullosa of the left middle turbinate is again noted. The mastoid air cells and middle ear cavities are clear. The globes are intact. Other: No scalp lesions or hematoma. IMPRESSION: No acute intracranial findings. Stable large mucous retention cysts in both maxillary sinuses. Electronically Signed   By: Rudie Meyer M.D.   On: 11/01/2015 18:26    EKG  EKG Interpretation  Date/Time:  Monday November 01 2015 17:39:28 EDT Ventricular Rate:  98 PR Interval:  130 QRS Duration: 86 QT Interval:  346 QTC Calculation: 441 R Axis:   56 Text Interpretation:  Normal sinus rhythm Right atrial enlargement Possible Anterior infarct , age undetermined Abnormal ECG No old tracing to compare Confirmed by [facility]  MD, DAVID ([id number]) on 11/01/2015 11:22:06 PM       Radiology Ct Head Wo Contrast  Result Date: 11/01/2015 CLINICAL DATA:  Altered mental status. EXAM: CT HEAD WITHOUT CONTRAST TECHNIQUE: Contiguous axial images were obtained from the base of the skull through the vertex without intravenous contrast. COMPARISON:  Head CT 07/07/2007 and MRI brain 07/08/2012 FINDINGS: Brain: The ventricles are normal  in size and configuration. No extra-axial fluid collections are identified. The gray-white differentiation is normal. No CT findings for acute intracranial process such as hemorrhage or infarction. No mass lesions. Stable calcification in a right frontal sulcus. The brainstem and cerebellum are grossly  normal. Vascular: No vascular calcifications, aneurysm or hyperdense vessels. Skull: No skull fracture or bone lesions. Sinuses/Orbits: Stable large mucous retention cysts or polyps in both maxillary sinuses. Concha bullosa of the left middle turbinate is again noted. The mastoid air cells and middle ear cavities are clear. The globes are intact. Other: No scalp lesions or hematoma. IMPRESSION: No acute intracranial findings. Stable large mucous retention cysts in both maxillary sinuses. Electronically Signed   By: Rudie Meyer M.D.   On: 11/01/2015 18:26    Procedures Procedures (including critical care time)  Medications Ordered in ED Medications - No data to display   Initial Impression / Assessment and Plan / ED Course  I have reviewed the triage vital signs and the nursing notes.  Pertinent labs & imaging results that were available during my care of the patient were reviewed by me and considered in my medical decision making (see chart for details).  Clinical Course    Patient presents with decline over 1-2 months as described in HPI. He is exhibiting tremor of UE's, shuffling gait characteristic of parkinsonian symptoms. Stable currently. Wife at bedside who is exceptional care giver.  He is examined by Dr. Preston Fleeting who feels admission is required to stabilize symptoms if possible, and for neurologic consultation as to whether symptoms reflect adverse medication reaction or onset of Parkinson's.  Discussed with Dr. Roseanne Reno who will provide consultation.   Discussed with Dr. Clyde Lundborg who accepts the patient for admission onto his service.   Final Clinical Impressions(s) / ED Diagnoses   Final diagnoses:  None  1. Parkinsonian features  New Prescriptions New Prescriptions   No medications on file     Elpidio Anis, Cordelia Poche 11/02/15 0118    Dione Booze, MD 11/02/15 [phone]

## 2015-11-02 ENCOUNTER — Observation Stay (HOSPITAL_BASED_OUTPATIENT_CLINIC_OR_DEPARTMENT_OTHER)
Admit: 2015-11-02 | Discharge: 2015-11-02 | Disposition: A | Discharge status: Home / Self Care | Payer: Medicare Other | Attending: Nurse Practitioner | Admitting: Nurse Practitioner

## 2015-11-02 ENCOUNTER — Observation Stay (HOSPITAL_COMMUNITY): Payer: Medicare Other

## 2015-11-02 ENCOUNTER — Encounter (HOSPITAL_COMMUNITY): Payer: Self-pay | Admitting: Family Medicine

## 2015-11-02 DIAGNOSIS — R404 Transient alteration of awareness: Secondary | ICD-10-CM | POA: Diagnosis not present

## 2015-11-02 DIAGNOSIS — R2681 Unsteadiness on feet: Secondary | ICD-10-CM | POA: Diagnosis not present

## 2015-11-02 DIAGNOSIS — G252 Other specified forms of tremor: Secondary | ICD-10-CM | POA: Diagnosis not present

## 2015-11-02 DIAGNOSIS — E785 Hyperlipidemia, unspecified: Secondary | ICD-10-CM | POA: Diagnosis not present

## 2015-11-02 DIAGNOSIS — G9340 Encephalopathy, unspecified: Secondary | ICD-10-CM

## 2015-11-02 DIAGNOSIS — G20 Parkinson's disease: Secondary | ICD-10-CM | POA: Diagnosis not present

## 2015-11-02 DIAGNOSIS — R4182 Altered mental status, unspecified: Secondary | ICD-10-CM

## 2015-11-02 DIAGNOSIS — R413 Other amnesia: Secondary | ICD-10-CM

## 2015-11-02 DIAGNOSIS — R259 Unspecified abnormal involuntary movements: Secondary | ICD-10-CM | POA: Insufficient documentation

## 2015-11-02 DIAGNOSIS — E538 Deficiency of other specified B group vitamins: Secondary | ICD-10-CM

## 2015-11-02 DIAGNOSIS — R29818 Other symptoms and signs involving the nervous system: Secondary | ICD-10-CM | POA: Insufficient documentation

## 2015-11-02 DIAGNOSIS — F1910 Other psychoactive substance abuse, uncomplicated: Secondary | ICD-10-CM | POA: Diagnosis present

## 2015-11-02 DIAGNOSIS — F200 Paranoid schizophrenia: Secondary | ICD-10-CM | POA: Diagnosis not present

## 2015-11-02 DIAGNOSIS — D539 Nutritional anemia, unspecified: Secondary | ICD-10-CM

## 2015-11-02 LAB — CK: Total CK: 96 U/L (ref 49–397)

## 2015-11-02 LAB — TSH: TSH: 1.694 u[IU]/mL (ref 0.350–4.500)

## 2015-11-02 LAB — MAGNESIUM: Magnesium: 2.1 mg/dL (ref 1.7–2.4)

## 2015-11-02 LAB — FOLATE: Folate: 9 ng/mL (ref >5.9)

## 2015-11-02 LAB — VITAMIN B12: Vitamin B-12: 496 pg/mL (ref 180–914)

## 2015-11-02 LAB — RPR: RPR Ser Ql: NONREACTIVE

## 2015-11-02 MED ORDER — GABAPENTIN 100 MG PO CAPS
100.0000 mg | ORAL_CAPSULE | Freq: Three times a day (TID) | ORAL | Status: DC
  Administered 2015-11-02 – 2015-11-03 (×5): 100 mg via ORAL
  Filled 2015-11-02 (×5): qty 1

## 2015-11-02 MED ORDER — BENZTROPINE MESYLATE 1 MG/ML IJ SOLN
1.0000 mg | Freq: Once | INTRAMUSCULAR | Status: AC
  Administered 2015-11-02: 1 mg via INTRAMUSCULAR
  Filled 2015-11-02: qty 2

## 2015-11-02 MED ORDER — PREGABALIN 75 MG PO CAPS
75.0000 mg | ORAL_CAPSULE | Freq: Every day | ORAL | Status: DC
  Administered 2015-11-02: 75 mg via ORAL
  Filled 2015-11-02: qty 1

## 2015-11-02 MED ORDER — ACYCLOVIR 400 MG PO TABS
400.0000 mg | ORAL_TABLET | Freq: Two times a day (BID) | ORAL | Status: DC
  Administered 2015-11-02 – 2015-11-03 (×3): 400 mg via ORAL
  Filled 2015-11-02 (×4): qty 1

## 2015-11-02 MED ORDER — LORATADINE 10 MG PO TABS
10.0000 mg | ORAL_TABLET | Freq: Every day | ORAL | Status: DC
  Administered 2015-11-02 – 2015-11-03 (×2): 10 mg via ORAL
  Filled 2015-11-02 (×2): qty 1

## 2015-11-02 MED ORDER — ENOXAPARIN SODIUM 40 MG/0.4ML ~~LOC~~ SOLN
40.0000 mg | SUBCUTANEOUS | Status: DC
  Administered 2015-11-02 – 2015-11-03 (×2): 40 mg via SUBCUTANEOUS
  Filled 2015-11-02 (×2): qty 0.4

## 2015-11-02 MED ORDER — ACETAMINOPHEN 325 MG PO TABS
650.0000 mg | ORAL_TABLET | Freq: Four times a day (QID) | ORAL | Status: DC | PRN
  Administered 2015-11-03: 650 mg via ORAL
  Filled 2015-11-02: qty 2

## 2015-11-02 MED ORDER — GADOBENATE DIMEGLUMINE 529 MG/ML IV SOLN
20.0000 mL | Freq: Once | INTRAVENOUS | Status: AC
  Administered 2015-11-02: 18 mL via INTRAVENOUS

## 2015-11-02 MED ORDER — PRAVASTATIN SODIUM 40 MG PO TABS
40.0000 mg | ORAL_TABLET | Freq: Every day | ORAL | Status: DC
Start: 2015-11-02 — End: 2015-11-03
  Administered 2015-11-02 – 2015-11-03 (×2): 40 mg via ORAL
  Filled 2015-11-02 (×2): qty 1

## 2015-11-02 MED ORDER — PANTOPRAZOLE SODIUM 40 MG PO TBEC
40.0000 mg | DELAYED_RELEASE_TABLET | Freq: Every day | ORAL | Status: DC
  Administered 2015-11-02 – 2015-11-03 (×2): 40 mg via ORAL
  Filled 2015-11-02 (×2): qty 1

## 2015-11-02 MED ORDER — VITAMIN B-12 1000 MCG PO TABS
1000.0000 ug | ORAL_TABLET | Freq: Every day | ORAL | Status: DC
  Administered 2015-11-02 – 2015-11-03 (×2): 1000 ug via ORAL
  Filled 2015-11-02 (×2): qty 1

## 2015-11-02 MED ORDER — SODIUM CHLORIDE 0.9% FLUSH
3.0000 mL | Freq: Two times a day (BID) | INTRAVENOUS | Status: DC
  Administered 2015-11-02 – 2015-11-03 (×3): 3 mL via INTRAVENOUS

## 2015-11-02 MED ORDER — OLANZAPINE 5 MG PO TABS
5.0000 mg | ORAL_TABLET | Freq: Every day | ORAL | Status: DC
  Administered 2015-11-02: 5 mg via ORAL
  Filled 2015-11-02 (×2): qty 1

## 2015-11-02 MED ORDER — CITALOPRAM HYDROBROMIDE 10 MG PO TABS
20.0000 mg | ORAL_TABLET | Freq: Every day | ORAL | Status: DC
  Administered 2015-11-02: 20 mg via ORAL
  Filled 2015-11-02: qty 2

## 2015-11-02 MED ORDER — BUSPIRONE HCL 10 MG PO TABS
15.0000 mg | ORAL_TABLET | Freq: Three times a day (TID) | ORAL | Status: DC
  Administered 2015-11-02 – 2015-11-03 (×5): 15 mg via ORAL
  Filled 2015-11-02 (×5): qty 2

## 2015-11-02 MED ORDER — ACETAMINOPHEN 650 MG RE SUPP: 650.0000 mg | Freq: Four times a day (QID) | RECTAL | Status: DC | PRN

## 2015-11-02 MED ORDER — BENZTROPINE MESYLATE 0.5 MG PO TABS
1.0000 mg | ORAL_TABLET | Freq: Two times a day (BID) | ORAL | Status: DC
  Administered 2015-11-02 – 2015-11-03 (×3): 1 mg via ORAL
  Filled 2015-11-02 (×3): qty 2

## 2015-11-02 MED ORDER — LORAZEPAM 2 MG/ML IJ SOLN: 1.0000 mg | Freq: Once | INTRAMUSCULAR | Status: DC

## 2015-11-02 MED ORDER — TRAMADOL HCL 50 MG PO TABS: 50.0000 mg | ORAL_TABLET | Freq: Four times a day (QID) | ORAL | Status: DC | PRN

## 2015-11-02 NOTE — Progress Notes (Signed)
EEG Completed; Results Pending  

## 2015-11-02 NOTE — Progress Notes (Signed)
This is a no charge note  Pending admission per PA, Upstill  45 year old man with past medical history of paranoid schizophrenia, neuropathy, tobacco abuse, hypertension, who presents with altered mental status,  decreased memory, difficulty walking, Parkinson's symptoms of tremor. CT-head is negative. Likely due to Paliperidone side effect. Started with Cogentin. EDP consulted neurology, Dr. Roseanne RenoStewart.   Lorretta HarpXilin Corin Formisano, MD  [facility] Pager [phone]  If 7PM-7AM, please contact night-coverage [email or web address] Password [email or web address] 11/02/2015, 4:54 AM

## 2015-11-02 NOTE — Consult Note (Signed)
Admission H&P    Chief Complaint: Altered mental status and gait changes.  HPI: Todd Winters. is an 45 y.o. male with a history of paranoid schizophrenia, vitamin B 12 deficiency and peripheral neuropathy presenting with progressive mental status changes and gait changes over past 2 months. His wife indicates that his short-term memory has been getting progressively worse and overall mental processing appeared to have slowed. She describes his gait is shuffling in quality. Patient has been treated with Lorayne Bender once a month for about 1 year. Vitamin B 12 checked 6 months ago reportedly was within normal range. He is on oral vitamin B-12 replacement. CT scan of his head in the ED showed no intracranial abnormality. Patient is being admitted for workup for mental status changes and gait instability.  Past Medical History:  Diagnosis Date  . Neuropathy ([place])   . Paranoid schizophrenia ([place])   . Vitamin B 12 deficiency     History reviewed. No pertinent surgical history.  Family History  Problem Relation Age of Onset  . Adopted: Yes   Social History:  reports that he has been smoking.  He has been smoking about 2.00 packs per day. He has never used smokeless tobacco. He reports that he does not drink alcohol or use drugs.  Allergies: No Known Allergies  Medications: Preadmission medications were reviewed by me.  ROS: History obtained from spouse  General ROS: negative for - chills, fatigue, fever, night sweats, weight gain or weight loss Psychological ROS: As noted in present illness  Ophthalmic ROS: negative for - blurry vision, double vision, eye pain or loss of vision ENT ROS: negative for - epistaxis, nasal discharge, oral lesions, sore throat, tinnitus or vertigo Allergy and Immunology ROS: negative for - hives or itchy/watery eyes Hematological and Lymphatic ROS: negative for - bleeding problems, bruising or swollen lymph nodes Endocrine ROS: negative for - galactorrhea, hair  pattern changes, polydipsia/polyuria or temperature intolerance Respiratory ROS: negative for - cough, hemoptysis, shortness of breath or wheezing Cardiovascular ROS: negative for - chest pain, dyspnea on exertion, edema or irregular heartbeat Gastrointestinal ROS: negative for - abdominal pain, diarrhea, hematemesis, nausea/vomiting or stool incontinence Genito-Urinary ROS: negative for - dysuria, hematuria, incontinence or urinary frequency/urgency Musculoskeletal ROS: negative for - joint swelling or muscular weakness Neurological ROS: as noted in HPI Dermatological ROS: negative for rash and skin lesion changes  Physical Examination: Blood pressure 107/74, pulse 66, temperature 98.5 F (36.9 C), resp. rate 16, height 6' (1.829 m), weight 82.7 kg (182 lb 6 oz), SpO2 98 %.  HEENT-  Normocephalic, no lesions, without obvious abnormality.  Normal external eye and conjunctiva.  Normal TM's bilaterally.  Normal auditory canals and external ears. Normal external nose, mucus membranes and septum.  Normal pharynx. Neck supple with no masses, nodes, nodules or enlargement. Cardiovascular - regular rate and rhythm, S1, S2 normal, no murmur, click, rub or gallop Lungs - chest clear, no wheezing, rales, normal symmetric air entry Abdomen - soft, non-tender; bowel sounds normal; no masses,  no organomegaly Extremities - no joint deformities, effusion, or inflammation and no edema  Neurologic Examination: Mental Status: Alert, disoriented to current age and month, flat affect.  Speech slightly slurred without evidence of aphasia. Able to follow commands without difficulty. Cranial Nerves: II-Visual fields were normal. III/IV/VI-Pupils were equal and reacted. Extraocular movements were full and conjugate.    V/VII-no facial numbness and no facial weakness. VIII-normal. X-mild dysarthria; symmetrical palatal movement. XI: trapezius strength/neck flexion strength normal bilaterally XII-midline tongue  extension with normal strength. Motor: 5/5 bilaterally with normal tone and bulk; normal muscle tone throughout; no resting tremor. Sensory: Stocking distribution loss of sensation to touch and vibration in lower extremities distally. Deep Tendon Reflexes: 1+ and symmetric. Plantars: Mute bilaterally Cerebellar: Normal finger-to-nose testing. Carotid auscultation: Normal  Results for orders placed or performed during the hospital encounter of 11/01/15 (from the past 48 hour(s))  Protime-INR     Status: None   Collection Time: 11/01/15  5:33 PM  Result Value Ref Range   Prothrombin Time 13.1 11.4 - 15.2 seconds   INR 0.99   APTT     Status: None   Collection Time: 11/01/15  5:33 PM  Result Value Ref Range   aPTT 30 24 - 36 seconds  CBC     Status: Abnormal   Collection Time: 11/01/15  5:33 PM  Result Value Ref Range   WBC 7.4 4.0 - 10.5 K/uL   RBC 4.10 (L) 4.22 - 5.81 MIL/uL   Hemoglobin 13.9 13.0 - 17.0 g/dL   HCT 42.0 39.0 - 52.0 %   MCV 102.4 (H) 78.0 - 100.0 fL   MCH 33.9 26.0 - 34.0 pg   MCHC 33.1 30.0 - 36.0 g/dL   RDW 13.0 11.5 - 15.5 %   Platelets 276 150 - 400 K/uL  Differential     Status: None   Collection Time: 11/01/15  5:33 PM  Result Value Ref Range   Neutrophils Relative % 64 %   Neutro Abs 4.7 1.7 - 7.7 K/uL   Lymphocytes Relative 29 %   Lymphs Abs 2.1 0.7 - 4.0 K/uL   Monocytes Relative 6 %   Monocytes Absolute 0.5 0.1 - 1.0 K/uL   Eosinophils Relative 1 %   Eosinophils Absolute 0.0 0.0 - 0.7 K/uL   Basophils Relative 0 %   Basophils Absolute 0.0 0.0 - 0.1 K/uL  Comprehensive metabolic panel     Status: Abnormal   Collection Time: 11/01/15  5:33 PM  Result Value Ref Range   Sodium 141 135 - 145 mmol/L   Potassium 3.9 3.5 - 5.1 mmol/L   Chloride 107 101 - 111 mmol/L   CO2 26 22 - 32 mmol/L   Glucose, Bld 93 65 - 99 mg/dL   BUN 9 6 - 20 mg/dL   Creatinine, Ser 0.99 0.61 - 1.24 mg/dL   Calcium 9.3 8.9 - 10.3 mg/dL   Total Protein 7.4 6.5 - 8.1 g/dL    Albumin 4.5 3.5 - 5.0 g/dL   AST 17 15 - 41 U/L   ALT 14 (L) 17 - 63 U/L   Alkaline Phosphatase 48 38 - 126 U/L   Total Bilirubin 0.5 0.3 - 1.2 mg/dL   GFR calc non Af Amer >60 >60 mL/min   GFR calc Af Amer >60 >60 mL/min    Comment: (NOTE) The eGFR has been calculated using the CKD EPI equation. This calculation has not been validated in all clinical situations. eGFR's persistently <60 mL/min signify possible Chronic Kidney Disease.    Anion gap 8 5 - 15  CBG monitoring, ED     Status: None   Collection Time: 11/01/15  5:39 PM  Result Value Ref Range   Glucose-Capillary 86 65 - 99 mg/dL  I-stat troponin, ED     Status: None   Collection Time: 11/01/15  5:53 PM  Result Value Ref Range   Troponin i, poc 0.00 0.00 - 0.08 ng/mL   Comment 3  Comment: Due to the release kinetics of cTnI, a negative result within the first hours of the onset of symptoms does not rule out myocardial infarction with certainty. If myocardial infarction is still suspected, repeat the test at appropriate intervals.   I-Stat Chem 8, ED     Status: Abnormal   Collection Time: 11/01/15  5:54 PM  Result Value Ref Range   Sodium 143 135 - 145 mmol/L   Potassium 3.8 3.5 - 5.1 mmol/L   Chloride 104 101 - 111 mmol/L   BUN 11 6 - 20 mg/dL   Creatinine, Ser 1.00 0.61 - 1.24 mg/dL   Glucose, Bld 92 65 - 99 mg/dL   Calcium, Ion 1.08 (L) 1.15 - 1.40 mmol/L   TCO2 27 0 - 100 mmol/L   Hemoglobin 14.3 13.0 - 17.0 g/dL   HCT 42.0 39.0 - 52.0 %  Vitamin B12     Status: None   Collection Time: 11/02/15 12:17 AM  Result Value Ref Range   Vitamin B-12 496 180 - 914 pg/mL    Comment: (NOTE) This assay is not validated for testing neonatal or myeloproliferative syndrome specimens for Vitamin B12 levels.   Folate     Status: None   Collection Time: 11/02/15 12:17 AM  Result Value Ref Range   Folate 9.0 >5.9 ng/mL   Ct Head Wo Contrast  Result Date: 11/01/2015 CLINICAL DATA:  Altered mental status.  EXAM: CT HEAD WITHOUT CONTRAST TECHNIQUE: Contiguous axial images were obtained from the base of the skull through the vertex without intravenous contrast. COMPARISON:  Head CT 07/07/2007 and MRI brain 07/08/2012 FINDINGS: Brain: The ventricles are normal in size and configuration. No extra-axial fluid collections are identified. The gray-white differentiation is normal. No CT findings for acute intracranial process such as hemorrhage or infarction. No mass lesions. Stable calcification in a right frontal sulcus. The brainstem and cerebellum are grossly normal. Vascular: No vascular calcifications, aneurysm or hyperdense vessels. Skull: No skull fracture or bone lesions. Sinuses/Orbits: Stable large mucous retention cysts or polyps in both maxillary sinuses. Concha bullosa of the left middle turbinate is again noted. The mastoid air cells and middle ear cavities are clear. The globes are intact. Other: No scalp lesions or hematoma. IMPRESSION: No acute intracranial findings. Stable large mucous retention cysts in both maxillary sinuses. Electronically Signed   By: Marijo Sanes M.D.   On: 11/01/2015 18:26    Assessment/Plan 45 year old man with progressive cognitive decline over the past couple of months as well as progressive gait deterioration. Etiology is unclear, but possibly related to treatment with Invega. Patient has a history of vitamin B 12 deficiency which will need to be checked. Progressive primary dementia is unlikely but cannot be completely ruled out. CT scan of his head showed no signs of hydrocephalus.  Recommendations: 1. MRI of the brain without and with contrast 2. EEG, routine about study 3. Vitamin B-12 and folate levels, TSH and RPR 4. Defer further treatment with Invega for now 5. Physical therapy evaluation and recommendations regarding gait and ambulatory safety  We will continue to follow this patient with you.  C.R. Nicole Kindred, MD [facility] [phone]  11/02/2015, 6:14 AM

## 2015-11-02 NOTE — ED Notes (Signed)
Rolled the pt to the bathroom in a wheelchair.  Pt tolerated well, and was able to stand with minimal assistance between the wheelchair and the toilet.  When we returned to the pt's room, he endorsed leg pain as well as slight leg numbness.  He still appeared stable and needed little physical assistance to get from the wheelchair to the bed.

## 2015-11-02 NOTE — Evaluation (Signed)
Occupational Therapy Evaluation Patient Details Name: Todd Barren Sr. MRN: [id number] DOB: 04/12/1970 Today's Date: 11/02/2015    History of Present Illness  Todd Rindfleisch Sr. is a 45 y.o. male with medical history significant for paranoid schizophrenia, peripheral neuropathy, known vitamin B12 deficiency, dyslipidemia, former history of polysubstance abuse. brought to the ER after his wife noticed he was having difficulty with altered mentation, difficulty performing ADLs and new short-term memory loss.   Clinical Impression   This 45 yo male admitted with above presents to acute OT with deficits below (see OT problem list) thus affecting his PLOF of S (24/7). He will benefit from acute OT with follow up HHOT, HHAide, respite services (CM aware family is asking about this)    Follow Up Recommendations  Home health OT;Other (comment);Supervision/Assistance - 24 hour (HHAide)    Equipment Recommendations  Tub/shower seat       Precautions / Restrictions Precautions Precautions: Fall Restrictions Weight Bearing Restrictions: No      Mobility Bed Mobility Overal bed mobility: Independent                Transfers Overall transfer level: Needs assistance Equipment used: None Transfers: Sit to/from Stand Sit to Stand: Min guard              Balance Overall balance assessment: Needs assistance Sitting-balance support: No upper extremity supported;Feet supported Sitting balance-Leahy Scale: Good     Standing balance support: Single extremity supported;During functional activity Standing balance-Leahy Scale: Poor Standing balance comment: when ambulating in hallway he felt he needed to keep one hand on handrail, pt reported that his legs felt weak in standing                            ADL Overall ADL's : Needs assistance/impaired Eating/Feeding: Independent;Sitting   Grooming: Min guard;Wash/dry hands;Standing   Upper Body Bathing: Supervision/  safety;Set up;Sitting   Lower Body Bathing: Min guard;Sit to/from stand   Upper Body Dressing : Set up;Supervision/safety;Sitting   Lower Body Dressing: Min guard;Sit to/from stand   Toilet Transfer: Min guard;Ambulation;Grab bars;Regular Social worker and Hygiene: Min guard;Sit to/from stand                         Pertinent Vitals/Pain Pain Assessment: No/denies pain     Hand Dominance  right   Extremity/Trunk Assessment Upper Extremity Assessment Upper Extremity Assessment: Overall WFL for tasks assessed   Lower Extremity Assessment Lower Extremity Assessment: Defer to PT evaluation       Communication Communication Communication:  (currently delayed in responses)   Cognition Arousal/Alertness: Awake/alert Behavior During Therapy: Flat affect Overall Cognitive Status: Impaired/Different from baseline Area of Impairment: Following commands     Memory: Decreased short-term memory (pt went to bathroom, came out and washed his hands; 5 minutes later he said he needed to wash his hands (he had not remembered that he had already washed them as well as he could not state why he needed to wash them)) Following Commands: Follows one step commands with increased time       General Comments: Pt does have pre-exsisting cognitive issues; but wife reports his short term memory is worse and his delay in answering questions is worse              Home Living Family/patient expects to be discharged to:: Private residence Living Arrangements: Spouse/significant other;Children;Other relatives (father and  mother in law) Available Help at Discharge: Family;Available 24 hours/day Type of Home: House             Bathroom Shower/Tub: Tub/shower unit;Curtain Shower/tub characteristics: Engineer, building servicesCurtain Bathroom Toilet: Handicapped height     Home Equipment: None          Prior Functioning/Environment Level of Independence: Needs assistance   Gait / Transfers Assistance Needed: independent ADL's / Homemaking Assistance Needed: Wife reports that they try their best to get him to take care of his basic needs (they can only get him to agree to a shower once a week and he only changes his clothes once a week)            OT Problem List: Impaired balance (sitting and/or standing);Decreased cognition   OT Treatment/Interventions: Self-care/ADL training;Balance training;DME and/or AE instruction;Therapeutic activities;Patient/family education;Cognitive remediation/compensation    OT Goals(Current goals can be found in the care plan section) Acute Rehab OT Goals Patient Stated Goal: wife--"to figure out what is going on and get some respite care services" OT Goal Formulation: With patient/family Time For Goal Achievement: 11/09/15 Potential to Achieve Goals: Good  OT Frequency: Min 2X/week              End of Session Equipment Utilized During Treatment: Gait belt  Activity Tolerance: Patient tolerated treatment well Patient left: in chair   Time: 1323-1400 OT Time Calculation (min): 37 min Charges:  OT General Charges $OT Visit: 1 Procedure OT Evaluation $OT Eval Moderate Complexity: 1 Procedure OT Treatments $Self Care/Home Management : 8-22 mins G-Codes: OT G-codes **NOT FOR INPATIENT CLASS** Functional Assessment Tool Used: clinical observation Functional Limitation: Self care Self Care Current Status ([id number]): At least 1 percent but less than 20 percent impaired, limited or restricted Self Care Goal Status ([id number]): At least 1 percent but less than 20 percent impaired, limited or restricted  Evette GeorgesLeonard, Kellis Topete Eva [phone] 11/02/2015, 2:32 PM

## 2015-11-02 NOTE — ED Notes (Addendum)
Pt. Being admitted for possible parkinson's-like symptoms. Neuro checks complete at this time. Pt. Passed swallow scale. Pt. Wife reports pt. Difficulty with ADL and a new onset of shuffling gait. Pt. Speech clear, but slow at this time-differs from baseline per wife. Pt. Scheduled for EEG today. Pt. Only oriented to himself and situation. Pt. Wheeled to restroom, but does endorse some numbness to his legs.

## 2015-11-02 NOTE — ED Notes (Signed)
Ordered breakfast tray at [phone]

## 2015-11-02 NOTE — H&P (Signed)
History and Physical    Todd Saneaul Picotte Sr. DGL:[id number] DOB: 09/10/1970 DOA: 11/01/2015   PCP: Ihor GullyStephanie J Powers, MD   Patient coming from/Resides with: Private residence/lives with wife  Admission status: Observation/neuro telemetry   Chief Complaint: Altered mental status and coarse tremors  HPI: Todd Saneaul Consolo Sr. is a 45 y.o. male with medical history significant for paranoid schizophrenia followed by Dr. Jannifer FranklinAkintayo, peripheral neuropathy, known vitamin B12 deficiency, dyslipidemia, former history of polysubstance abuse. Patient was brought to the ER after his wife noticed he was having difficulty with altered mentation, difficulty performing ADLs and new short-term memory loss. He initially presented to his psychiatrist who sent the patient to the ER for mental health evaluation and to rule out any neurological changes. The wife also reported patient having difficulty walking and noticed he was shuffling his feet and his speech appeared to be slurred. No other symptoms. Of note he was started recently on a new antipsychotic medication.  Since arrival to the emergency department patient has been evaluated by neurology who felt that the patient's symptoms may be coming from his Invega and likely reflective of EPS symptoms. Recommendations were made (see note) and a one-time dose of Cogentin IM was administered. I have subsequently evaluated the patient who has nearly returned to baseline. He is no longer having resting tremors. According to his wife, his affect remains more flat than usual and he still having some slowness to respond but otherwise is markedly improved. Patient himself tells me that at the onset of the symptoms he noticed himself being quite agitated and pacing extensively. He was having pleuritic chest discomfort with breathing. He also is having some dizziness that his wife thought was related to orthostasis.  ED Course:  Vital Signs: BP 111/64 (BP Location: Left Arm)   Pulse 75    Temp 97.8 F (36.6 C) (Oral)   Resp 18   Ht 6' (1.829 m)   Wt 82.7 kg (182 lb 6 oz)   SpO2 98%   BMI 24.73 kg/m  CT head without contrast: No acute intracranial findings Lab data: Sodium 143, potassium 3.8, chloride 14, BUN 11, creatinine 1.00, glucose 92, poc troponin 0.00, white count 7400 and normal differential, hemoglobin 13.9, MCV 102.4, platelets 276,000, coags within normal limits Medications and treatments: Cogentin 1 mg IM 1  Review of Systems:  In addition to the HPI above,  No Fever-chills, myalgias or other constitutional symptoms No Headache, changes with Vision or hearing, new weakness, tingling, numbness in any extremity, dizziness, dysarthria or word finding difficulty, gait disturbance or imbalance, tremors or seizure activity No problems swallowing food or Liquids, indigestion/reflux, choking or coughing while eating, abdominal pain with or after eating No Chest pain, Cough or Shortness of Breath, palpitations, orthopnea or DOE No Abdominal pain, N/V, melena,hematochezia, dark tarry stools, constipation No dysuria, malodorous urine, hematuria or flank pain No new skin rashes, lesions, masses or bruises, No new joint pains, aches, swelling or redness No recent unintentional weight gain or loss No polyuria, polydypsia or polyphagia   Past Medical History:  Diagnosis Date  . Neuropathy (HCC)   . Paranoid schizophrenia (HCC)   . Vitamin B 12 deficiency     History reviewed. No pertinent surgical history.  Social History   Social History  . Marital status: Married    Spouse name: N/A  . Number of children: N/A  . Years of education: N/A   Occupational History  . Not on file.   Social History Main Topics  .  Smoking status: Current Every Day Smoker    Packs/day: 2.00  . Smokeless tobacco: Never Used  . Alcohol use No     Comment: HIstory of cocaine abuse  . Drug use: No     Comment: history of cocane abuse  . Sexual activity: Not on file   Other  Topics Concern  . Not on file   Social History Narrative  . No narrative on file    Mobility: Without assistive devices Work history: Disabled   No Known Allergies  Family History  Problem Relation Age of Onset  . Adopted: Yes   Family history reviewed and not pertinent to current admission status and findings  Prior to Admission medications   Medication Sig Start Date End Date Taking? Authorizing Provider  acyclovir (ZOVIRAX) 400 MG tablet Take 400 mg by mouth 2 (two) times daily.   Yes Historical Provider, MD  busPIRone (BUSPAR) 10 MG tablet Take 15 mg by mouth 3 (three) times daily.  06/18/12  Yes Historical Provider, MD  citalopram (CELEXA) 20 MG tablet Take 20 mg by mouth at bedtime. 06/18/12  Yes Historical Provider, MD  gabapentin (NEURONTIN) 100 MG capsule Take 100 mg by mouth 3 (three) times daily. 06/18/12  Yes Historical Provider, MD  lovastatin (MEVACOR) 40 MG tablet Take 40 mg by mouth at bedtime.   Yes Historical Provider, MD  OLANZapine (ZYPREXA) 10 MG tablet Take 5 mg by mouth at bedtime.  06/18/12  Yes Historical Provider, MD  omeprazole (PRILOSEC) 20 MG capsule Take 40 mg by mouth at bedtime.    Yes Historical Provider, MD  Paliperidone Palmitate 117 MG/0.75ML SUSP Inject 1 each into the muscle every 30 (thirty) days.   Yes Historical Provider, MD  pantoprazole (PROTONIX) 40 MG tablet Take 40 mg by mouth every morning.   Yes Historical Provider, MD  pregabalin (LYRICA) 50 MG capsule Take 75 mg by mouth daily.    Yes Historical Provider, MD  traMADol (ULTRAM) 50 MG tablet Take 1 tablet (50 mg total) by mouth every 8 (eight) hours as needed for pain. 09/18/12  Yes Earley Favor, NP  vitamin B-12 (CYANOCOBALAMIN) 1000 MCG tablet Take 1,000 mcg by mouth daily.   Yes Historical Provider, MD    Physical Exam: Vitals:   11/02/15 0600 11/02/15 0700 11/02/15 0734 11/02/15 0818  BP: 107/74 108/74 108/74 111/64  Pulse: 66 69 73 75  Resp: 16 23 16 18   Temp:    97.8 F (36.6  C)  TempSrc:    Oral  SpO2: 98% 98% 98% 98%  Weight:      Height:          Constitutional: NAD, calm, Appears comfortable Eyes: PERRL, lids and conjunctivae normal ENMT: Mucous membranes are dry. Posterior pharynx clear of any exudate or lesions.Normal dentition.  Neck: normal, supple, no masses, no thyromegaly Respiratory: clear to auscultation bilaterally, no wheezing, no crackles. Normal respiratory effort. No accessory muscle use.  Cardiovascular: Regular rate and rhythm, no murmurs / rubs / gallops. No extremity edema. 2+ pedal pulses. No carotid bruits.  Abdomen: no tenderness, no masses palpated. No hepatosplenomegaly. Bowel sounds positive.  Musculoskeletal: no clubbing / cyanosis. No joint deformity upper and lower extremities. Good ROM, no contractures. Normal muscle tone.  Skin: no rashes, lesions, ulcers. No induration Neurologic: CN 2-12 grossly intact. Sensation intact, DTR normal. Strength 5/5 x all 4 extremities.  Psychiatric: Alert and oriented x 3. Normal mood. Flat affect with mild psychomotor retardation   Labs on Admission: I  have personally reviewed following labs and imaging studies  CBC:  Recent Labs Lab 11/01/15 1733 11/01/15 1754  WBC 7.4  --   NEUTROABS 4.7  --   HGB 13.9 14.3  HCT 42.0 42.0  MCV 102.4*  --   PLT 276  --    Basic Metabolic Panel:  Recent Labs Lab 11/01/15 1733 11/01/15 1754  NA 141 143  K 3.9 3.8  CL 107 104  CO2 26  --   GLUCOSE 93 92  BUN 9 11  CREATININE 0.99 1.00  CALCIUM 9.3  --    GFR: Estimated Creatinine Clearance: 103.5 mL/min (by C-G formula based on SCr of 1 mg/dL). Liver Function Tests:  Recent Labs Lab 11/01/15 1733  AST 17  ALT 14*  ALKPHOS 48  BILITOT 0.5  PROT 7.4  ALBUMIN 4.5   No results for input(s): LIPASE, AMYLASE in the last 168 hours. No results for input(s): AMMONIA in the last 168 hours. Coagulation Profile:  Recent Labs Lab 11/01/15 1733  INR 0.99   Cardiac Enzymes: No  results for input(s): CKTOTAL, CKMB, CKMBINDEX, TROPONINI in the last 168 hours. BNP (last 3 results) No results for input(s): PROBNP in the last 8760 hours. HbA1C: No results for input(s): HGBA1C in the last 72 hours. CBG:  Recent Labs Lab 11/01/15 1739  GLUCAP 86   Lipid Profile: No results for input(s): CHOL, HDL, LDLCALC, TRIG, CHOLHDL, LDLDIRECT in the last 72 hours. Thyroid Function Tests:  Recent Labs  11/02/15 0730  TSH 1.694   Anemia Panel:  Recent Labs  11/02/15 0017  VITAMINB12 496  FOLATE 9.0   Urine analysis:    Component Value Date/Time   COLORURINE YELLOW 05/18/2012 1512   APPEARANCEUR CLEAR 05/18/2012 1512   LABSPEC 1.032 (H) 05/18/2012 1512   PHURINE 6.0 05/18/2012 1512   GLUCOSEU 500 (A) 05/18/2012 1512   HGBUR TRACE (A) 05/18/2012 1512   BILIRUBINUR SMALL (A) 05/18/2012 1512   KETONESUR NEGATIVE 05/18/2012 1512   PROTEINUR NEGATIVE 05/18/2012 1512   UROBILINOGEN 1.0 05/18/2012 1512   NITRITE NEGATIVE 05/18/2012 1512   LEUKOCYTESUR NEGATIVE 05/18/2012 1512   Sepsis Labs: @LABRCNTIP (procalcitonin:4,lacticidven:4) )No results found for this or any previous visit (from the past 240 hour(s)).   Radiological Exams on Admission: Ct Head Wo Contrast  Result Date: 11/01/2015 CLINICAL DATA:  Altered mental status. EXAM: CT HEAD WITHOUT CONTRAST TECHNIQUE: Contiguous axial images were obtained from the base of the skull through the vertex without intravenous contrast. COMPARISON:  Head CT 07/07/2007 and MRI brain 07/08/2012 FINDINGS: Brain: The ventricles are normal in size and configuration. No extra-axial fluid collections are identified. The gray-white differentiation is normal. No CT findings for acute intracranial process such as hemorrhage or infarction. No mass lesions. Stable calcification in a right frontal sulcus. The brainstem and cerebellum are grossly normal. Vascular: No vascular calcifications, aneurysm or hyperdense vessels. Skull: No skull  fracture or bone lesions. Sinuses/Orbits: Stable large mucous retention cysts or polyps in both maxillary sinuses. Concha bullosa of the left middle turbinate is again noted. The mastoid air cells and middle ear cavities are clear. The globes are intact. Other: No scalp lesions or hematoma. IMPRESSION: No acute intracranial findings. Stable large mucous retention cysts in both maxillary sinuses. Electronically Signed   By: Rudie Meyer M.D.   On: 11/01/2015 18:26    EKG: (Independently reviewed) normal sinus rhythm with ventricular rate 90 bpm, QTC 441 ms, normal R-wave progression and no ST or T-wave changes that would be concerning  for ischemia  Assessment/Plan Principal Problem:   Altered mental status/Coarse tremors -Patient presents with altered mentation and associated coarse resting as well as action tremors that began after initiation of new antipsychotic medication and have subsequently resolved after 1 dose of IM Cogentin -Appreciate neurology input -MRI brain with and without contrast-sedate with IV Ativan -EEG to rule out seizure disorder -Hold Invega but will continue other psychotropic medications as below -Continue Cogentin 1 mg twice a day for at least one week with outpatient weaning and DC -Check vitamin B12 and folate levels as well as TSH and RPR -PT and OT evaluation guarding gait stability and safety for independent ambulation -Due to reported dizziness and patient with multiple psychotropic medications will check orthostatic vital signs; may need short-term IV fluids  Active Problems:   Adjustment disorder with mixed anxiety and depressed mood -Continue BuSpar and Celexa    Paranoid schizophrenia -Hold Invega -Continue Zyprexa -Agent and as above    HLD (hyperlipidemia) -Continue lovastatin -Check CK    History of Polysubstance abuse -Patient and wife report patient has been clean 2 years    B12 deficiency -As above    GERD -Continue Protonix -Check  magnesium in setting of tremors since Protonix can cause decrease in magnesium level      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 ANP-BC [facility] Pager [phone]   If 7PM-7AM, please contact night-coverage [email or web address] Password [email or web address]  11/02/2015, 9:18 AM

## 2015-11-02 NOTE — Procedures (Signed)
HPI:  45 y/o with MS change  TECHNICAL SUMMARY:  A multichannel referential and bipolar montage EEG using the standard international 10-20 system was performed on the patient described as lethargic.  The dominant background activity consists of excessive amount of fast (beta) activity that is reactive to eye opening and closing procedures.    ACTIVATION:  Stepwise photic stimulation and hyperventilation are not performed  EPILEPTIFORM ACTIVITY:  There were no spikes, sharp waves or paroxysmal activity.  SLEEP:  Physiologic drowsiness is noted, but no stage II sleep  CARDIAC:  The EKG lead was not well recorded  IMPRESSION:  This EEG demonstrated no focal, hemispheric, or lateralizing features.  No epileptiform activity was recorded.  There was an excessive amount of fast (beta) activity that was seen diffusely and is often due to medication such as benzodiazepines.  Correlate clinically.

## 2015-11-03 DIAGNOSIS — R259 Unspecified abnormal involuntary movements: Secondary | ICD-10-CM

## 2015-11-03 DIAGNOSIS — E785 Hyperlipidemia, unspecified: Secondary | ICD-10-CM | POA: Diagnosis not present

## 2015-11-03 DIAGNOSIS — F200 Paranoid schizophrenia: Secondary | ICD-10-CM

## 2015-11-03 DIAGNOSIS — F17210 Nicotine dependence, cigarettes, uncomplicated: Secondary | ICD-10-CM

## 2015-11-03 DIAGNOSIS — E538 Deficiency of other specified B group vitamins: Secondary | ICD-10-CM | POA: Diagnosis not present

## 2015-11-03 DIAGNOSIS — F4323 Adjustment disorder with mixed anxiety and depressed mood: Secondary | ICD-10-CM

## 2015-11-03 DIAGNOSIS — G252 Other specified forms of tremor: Secondary | ICD-10-CM | POA: Diagnosis not present

## 2015-11-03 DIAGNOSIS — G20 Parkinson's disease: Secondary | ICD-10-CM | POA: Diagnosis not present

## 2015-11-03 LAB — BASIC METABOLIC PANEL
Anion gap: 8 (ref 5–15)
BUN: 13 mg/dL (ref 6–20)
CO2: 24 mmol/L (ref 22–32)
Calcium: 9 mg/dL (ref 8.9–10.3)
Chloride: 106 mmol/L (ref 101–111)
Creatinine, Ser: 1 mg/dL (ref 0.61–1.24)
GFR calc Af Amer: >60 mL/min (ref >60)
GFR calc non Af Amer: >60 mL/min (ref >60)
Glucose, Bld: 88 mg/dL (ref 65–99)
Potassium: 4.5 mmol/L (ref 3.5–5.1)
Sodium: 138 mmol/L (ref 135–145)

## 2015-11-03 LAB — CBC
HCT: 39.8 % (ref 39.0–52.0)
Hemoglobin: 13 g/dL (ref 13.0–17.0)
MCH: 33.6 pg (ref 26.0–34.0)
MCHC: 32.7 g/dL (ref 30.0–36.0)
MCV: 102.8 fL — ABNORMAL HIGH (ref 78.0–100.0)
Platelets: 246 10*3/uL (ref 150–400)
RBC: 3.87 MIL/uL — ABNORMAL LOW (ref 4.22–5.81)
RDW: 13.4 % (ref 11.5–15.5)
WBC: 7 10*3/uL (ref 4.0–10.5)

## 2015-11-03 MED ORDER — ARIPIPRAZOLE 10 MG PO TABS
5.0000 mg | ORAL_TABLET | Freq: Two times a day (BID) | ORAL | Status: DC
  Administered 2015-11-03: 5 mg via ORAL
  Filled 2015-11-03: qty 1

## 2015-11-03 MED ORDER — ARIPIPRAZOLE 5 MG PO TABS: 5.0000 mg | ORAL_TABLET | Freq: Two times a day (BID) | ORAL | 0 refills | Status: AC

## 2015-11-03 MED ORDER — HYDROXYZINE PAMOATE 25 MG PO CAPS: 25.0000 mg | ORAL_CAPSULE | Freq: Three times a day (TID) | ORAL | 0 refills | Status: AC | PRN

## 2015-11-03 MED ORDER — BENZTROPINE MESYLATE 1 MG PO TABS: 1.0000 mg | ORAL_TABLET | Freq: Two times a day (BID) | ORAL | 0 refills | Status: AC

## 2015-11-03 NOTE — Care Management Obs Status (Signed)
MEDICARE OBSERVATION STATUS NOTIFICATION   Patient Details  Name: Todd Saneaul Caraway Sr. MRN: [id number] Date of Birth: 09/30/1970   Medicare Observation Status Notification Given:  Yes    Kermit BaloKelli F Lizeth Bencosme, RN 11/03/2015, 7:27 PM

## 2015-11-03 NOTE — Care Management Note (Signed)
Case Management Note  Patient Details  Name: Todd Dombek Sr. MRN: [id number] Date of Birth: [birth year]  Subjective/Objective:                    Action/Plan: CM met with patient and his wife. Pts wife requesting resources to assist with the care of her husband. She and her parents are providing 24 hour supervision of the patient and are becoming worn out. CM offered private duty list and pt's wife stated she could not afford private pay sitters. CM called psyche CSW and she stated to try [employer] through patients Medicaid. CM informed the wife about this. She called Medicaid and found that the patients Medicaid benefits are very limited. CM encouraged her to go to DSS and see if she could get his benefits increased due to his mental disability. Wife in agreement. Pt discharging with orders for home health services. CM provided her a list of [facility] agencies in [place]. She chose [facility]. Santiago Glad with [facility] notified and accepted the referral. Bedside RN updated.  Expected Discharge Date:                  Expected Discharge Plan:  [place]  In-House Referral:     Discharge planning Services  CM Consult  Post Acute Care Choice:  Home Health Choice offered to:  Spouse  DME Arranged:    DME Agency:     HH Arranged:  PT, OT, Nurse's Aide [place] Agency:  [facility]  Status of Service:  Completed, signed off  If discussed at [facility] [facility] of Stay Meetings, dates discussed:    Additional Comments:  Pollie Friar, RN 11/03/2015, 7:21 PM

## 2015-11-03 NOTE — Progress Notes (Signed)
Occupational Therapy Treatment Patient Details Name: Todd Bartles Sr. MRN: [id number] DOB: 20-Feb-1970 Today's Date: 11/03/2015    History of present illness  Todd Mckeehan Sr. is a 45 y.o. male with medical history significant for paranoid schizophrenia, peripheral neuropathy, known vitamin B12 deficiency, dyslipidemia, former history of polysubstance abuse. brought to the ER after his wife noticed he was having difficulty with altered mentation, difficulty performing ADLs and new short-term memory loss.   OT comments  Pt able to complete grooming activities, LB dressing, and tub transfer with supervision for safety. Pts wife concerned about pts hygiene/participation in ADL upon return home; requesting check off chart for daily hygiene activities. Created chart and provided to pt and wife. Educated pt and wife on encouraging functional independence and participation in ADL. D/c plan remains appropriate. Will continue to follow acutely.   Follow Up Recommendations  Home health OT;Other (comment);Supervision/Assistance - 24 hour (HH aide)    Equipment Recommendations  Tub/shower seat    Recommendations for Other Services      Precautions / Restrictions Precautions Precautions: Fall Restrictions Weight Bearing Restrictions: No       Mobility Bed Mobility Overal bed mobility: Independent                Transfers Overall transfer level: Needs assistance Equipment used: None Transfers: Sit to/from Stand Sit to Stand: Supervision         General transfer comment: Supervision for safety; no physical assist required. Increased time needed. No major LOB but mild unsteadiness noted with dynamic standing activities.    Balance Overall balance assessment: Needs assistance Sitting-balance support: Feet supported;No upper extremity supported Sitting balance-Leahy Scale: Good     Standing balance support: No upper extremity supported;During functional activity Standing  balance-Leahy Scale: Fair Standing balance comment: Reaching for objects to steady during functional mobility.                   ADL Overall ADL's : Needs assistance/impaired     Grooming: Supervision/safety;Standing;Oral care;Cueing for sequencing Grooming Details (indicate cue type and reason): Verbal cues for sequencing task. Increased time required.             Lower Body Dressing: Supervision/safety;Sit to/from stand Lower Body Dressing Details (indicate cue type and reason): Pt able to adjust socks sitting EOB. Toilet Transfer: Supervision/safety;Ambulation;Regular Teacher, adult education Details (indicate cue type and reason): Simulated by sit to stand from EOB and functional mobility in room.     Tub/ Shower Transfer: Supervision/safety;Tub transfer;Ambulation Tub/Shower Transfer Details (indicate cue type and reason): Educated pts wife on need for supervision during tub transfers and use of shower chair for safety with bathing. Functional mobility during ADLs: Supervision/safety General ADL Comments: Pts wife reports concerns regarding pts hygiene and completion of ADL; requesting personal hygiene chart that pt can use to keep track of completing ADL; provided with ADL/hygiene chart. Pt able to follow 2 step commands today in a minimally distracting environment with min verbal cues for sequencing.       Vision                     Perception     Praxis      Cognition   Behavior During Therapy: Flat affect Overall Cognitive Status: Impaired/Different from baseline Area of Impairment: Following commands        Following Commands: Follows one step commands with increased time       General Comments: Delay in responding to questions but does  respond appropriately. Able to follow 2 step commands during ADL.    Extremity/Trunk Assessment               Exercises     Shoulder Instructions       General Comments      Pertinent Vitals/  Pain       Pain Assessment: Faces Faces Pain Scale: Hurts little more Pain Location: knees during mobility Pain Descriptors / Indicators: Sore Pain Intervention(s): Monitored during session  Home Living                                          Prior Functioning/Environment              Frequency  Min 2X/week        Progress Toward Goals  OT Goals(current goals can now be found in the care plan section)  Progress towards OT goals: Progressing toward goals  Acute Rehab OT Goals Patient Stated Goal: home today after PT eval OT Goal Formulation: With patient/family  Plan Discharge plan remains appropriate    Co-evaluation                 End of Session     Activity Tolerance Patient tolerated treatment well   Patient Left Other (comment) (with PT in therapy gym)   Nurse Communication          Time: 1325-1350 OT Time Calculation (min): 25 min  Charges: OT General Charges $OT Visit: 1 Procedure OT Treatments $Self Care/Home Management : 23-37 mins  Gaye AlkenBailey A Jerriyah Louis M.S., OTR/L Pager: [phone]  11/03/2015, 2:03 PM

## 2015-11-03 NOTE — Progress Notes (Signed)
Subjective: Patient remains to have slow cognition and feel weak. Talking to his wife patient has been off of the and [place] since September 1 and apparently has been getting worse since then. Patient does have a significant history of polysubstance abuse, alcoholism, B12 deficiency.  Exam: Vitals:   11/03/15 0119 11/03/15 0512  BP: (!) 93/53 (!) 92/57  Pulse: (!) 52 (!) 50  Resp: 18 18  Temp: 97.6 F (36.4 C) 97.8 F (36.6 C)        Gen: In bed, NAD MS: Alert and oriented however he is slow with cognition. CN: 2 through 12 grossly intact Motor: 5/5 throughout Sensory: Stocking distribution polyneuropathy   Pertinent Labs/Diagnostics: MRI of the brain was within normal limits, EEG showed slowing but no epileptiform discharges.  Felicie MornDavid Smith PA-C [facility] [phone]  Impression: 45 year old man with progressive cognitive decline over the past couple of months as well as progressive gait deterioration. Etiology is unclear, but possibly related to treatment with Invega, versus multifocal deterioration given his history of significant alcohol abuse and polysubstance abuse in the past. At this time would recommend patient returning to his psychiatrist in addition having a follow-up visit with Dr. Apgar there outpatient neurologist. Neurology at this time will sign off  Greater than 40 minutes was spent with patient discussing medications, side effects, half-life, mechanism of action, MRI, EEG, follow-up visits.      11/03/2015, 10:39 AM

## 2015-11-03 NOTE — Progress Notes (Signed)
Pt being discharged from hospital per orders from MD. Pt and family educated on discharge instructions. Pt and family verbalized understanding of instructions. All questions and concerns were addressed. Pt's IV was removed before discharge. Pt exited hospital via wheelchair. 

## 2015-11-03 NOTE — Discharge Summary (Addendum)
Physician Discharge Summary  Todd Saneaul Shipley Sr. ZOX:[id number] DOB: 12/07/1970 DOA: 11/01/2015  PCP: Ihor GullyStephanie J Powers, MD  Admit date: 11/01/2015 Discharge date: 11/03/2015  Admitted From: Home Disposition:  Home  Recommendations for Outpatient Follow-up:  1. Follow up with PCP in 1-2 weeks 2. Follow up with your psychiatrist in 1 week 3. Take new medications as prescribed 4. Get Vitamin B12 shot 5. Can take Vistaril as needed for anxiety (up to 3 times a day)   Home Health:Yes Equipment/Devices:None  Discharge Condition:Stable CODE STATUS:Full Diet recommendation: Heart Healthy  Brief/Interim Summary: Todd Saneaul Shirah Sr. is a 45 y.o. male with medical history significant for paranoid schizophrenia followed by Dr. Jannifer FranklinAkintayo, peripheral neuropathy, known vitamin B12 deficiency, dyslipidemia, former history of polysubstance abuse. Patient was brought to the ER after his wife noticed he was having difficulty with altered mentation, difficulty performing ADLs and new short-term memory loss. He initially presented to his psychiatrist who sent the patient to the ER for mental health evaluation and to rule out any neurological changes. The wife also reported patient having difficulty walking and noticed he was shuffling his feet and his speech appeared to be slurred. No other symptoms. Of note he was started recently on a new antipsychotic medication.  Since arrival to the emergency department patient has been evaluated by neurology who felt that the patient's symptoms may be coming from his Invega and likely reflective of EPS symptoms. Recommendations were made (see note) and a one-time dose of Cogentin IM was administered. I have subsequently evaluated the patient who has nearly returned to baseline. He is no longer having resting tremors. According to his wife, his affect remains more flat than usual and he still having some slowness to respond but otherwise is markedly improved. Patient himself told  hospitalist that at the onset of the symptoms he noticed himself being quite agitated and pacing extensively. He was having pleuritic chest discomfort with breathing. He also is having some dizziness that his wife thought was related to orthostasis.  Neurology and Psychiatry was consulted.  Medication management per psychiatry.  Discharge Diagnoses:  Principal Problem:   Coarse tremors Active Problems:   B12 deficiency   Adjustment disorder with mixed anxiety and depressed mood   GERD   Altered mental status   HLD (hyperlipidemia)   History of Polysubstance abuse   Altered mental state   Macrocytic anemia    Discharge Instructions  Discharge Instructions    Ambulatory referral to Neurology    Complete by:  As directed    An appointment is requested in approximately: 1 week for 1 month cognitive decline with weakness. MRI nrain, TSH, B12, EEG all normal. On Invega but this was stopped Sept 1st   Call MD for:  difficulty breathing, headache or visual disturbances    Complete by:  As directed    Call MD for:  extreme fatigue    Complete by:  As directed    Call MD for:  persistant dizziness or light-headedness    Complete by:  As directed    Call MD for:  persistant nausea and vomiting    Complete by:  As directed    Diet - low sodium heart healthy    Complete by:  As directed    Increase activity slowly    Complete by:  As directed        Medication List    TAKE these medications   acyclovir 400 MG tablet Commonly known as:  ZOVIRAX Take 400 mg by  mouth 2 (two) times daily.   ARIPiprazole 5 MG tablet Commonly known as:  ABILIFY Take 1 tablet (5 mg total) by mouth 2 (two) times daily.   benztropine 1 MG tablet Commonly known as:  COGENTIN Take 1 tablet (1 mg total) by mouth 2 (two) times daily.   busPIRone 10 MG tablet Commonly known as:  BUSPAR Take 15 mg by mouth 3 (three) times daily.   citalopram 20 MG tablet Commonly known as:  CELEXA Take 20 mg by mouth  at bedtime.   gabapentin 100 MG capsule Commonly known as:  NEURONTIN Take 100 mg by mouth 3 (three) times daily.   hydrOXYzine 25 MG capsule Commonly known as:  VISTARIL Take 1 capsule (25 mg total) by mouth every 8 (eight) hours as needed for anxiety.   lovastatin 40 MG tablet Commonly known as:  MEVACOR Take 40 mg by mouth at bedtime.   OLANZapine 10 MG tablet Commonly known as:  ZYPREXA Take 5 mg by mouth at bedtime.   omeprazole 20 MG capsule Commonly known as:  PRILOSEC Take 40 mg by mouth at bedtime.   Paliperidone Palmitate 117 MG/0.75ML Susp Inject 1 each into the muscle every 30 (thirty) days.   pantoprazole 40 MG tablet Commonly known as:  PROTONIX Take 40 mg by mouth every morning.   pregabalin 50 MG capsule Commonly known as:  LYRICA Take 75 mg by mouth daily.   traMADol 50 MG tablet Commonly known as:  ULTRAM Take 1 tablet (50 mg total) by mouth every 8 (eight) hours as needed for pain.   vitamin B-12 1000 MCG tablet Commonly known as:  CYANOCOBALAMIN Take 1,000 mcg by mouth daily.       No Known Allergies  Consultations:  Neurology  Psychiatry   Procedures/Studies: Ct Head Wo Contrast  Result Date: 11/01/2015 CLINICAL DATA:  Altered mental status. EXAM: CT HEAD WITHOUT CONTRAST TECHNIQUE: Contiguous axial images were obtained from the base of the skull through the vertex without intravenous contrast. COMPARISON:  Head CT 07/07/2007 and MRI brain 07/08/2012 FINDINGS: Brain: The ventricles are normal in size and configuration. No extra-axial fluid collections are identified. The gray-white differentiation is normal. No CT findings for acute intracranial process such as hemorrhage or infarction. No mass lesions. Stable calcification in a right frontal sulcus. The brainstem and cerebellum are grossly normal. Vascular: No vascular calcifications, aneurysm or hyperdense vessels. Skull: No skull fracture or bone lesions. Sinuses/Orbits: Stable large  mucous retention cysts or polyps in both maxillary sinuses. Concha bullosa of the left middle turbinate is again noted. The mastoid air cells and middle ear cavities are clear. The globes are intact. Other: No scalp lesions or hematoma. IMPRESSION: No acute intracranial findings. Stable large mucous retention cysts in both maxillary sinuses. Electronically Signed   By: Rudie Meyer M.D.   On: 11/01/2015 18:26   Mr Laqueta Jean JY Contrast  Result Date: 11/02/2015 CLINICAL DATA:  Altered mental status and tremor. History of polysubstance abuse, schizophrenia. EXAM: MRI HEAD WITHOUT AND WITH CONTRAST TECHNIQUE: Multiplanar, multiecho pulse sequences of the brain and surrounding structures were obtained without and with intravenous contrast. CONTRAST:  18mL MULTIHANCE GADOBENATE DIMEGLUMINE 529 MG/ML IV SOLN COMPARISON:  CT HEAD November 01, 2015 and MRI of the head July 08, 2012 FINDINGS: INTRACRANIAL CONTENTS: No reduced diffusion to suggest acute ischemia. No susceptibility artifact to suggest hemorrhage. The ventricles and sulci are normal for patient's age. No suspicious parenchymal signal, mass lesions, mass effect. No abnormal intraparenchymal or extra-axial  enhancement. No abnormal extra-axial fluid collections. No extra-axial masses. Normal major intracranial vascular flow voids present at skull base. ORBITS: The included ocular globes and orbital contents are non-suspicious. SINUSES: Large bilateral maxillary mucosal retention cyst. SKULL/SOFT TISSUES: No abnormal sellar expansion. No suspicious calvarial bone marrow signal. Craniocervical junction maintained. IMPRESSION: Negative MRI head with and without contrast. Electronically Signed   By: Awilda Metro M.D.   On: 11/02/2015 22:58      Subjective: Patient awake and alert at time of exam.  Voices he occasionally has been feeling some mental fogginess.  Has reservations about new medications.  Voices that he feels slightly better than at time of  admission.  Wife requested Neurology consult for thorough evaluation to ensure that no other neurological etiology is present to explain symptoms.  Discharge Exam: Vitals:   11/03/15 0512 11/03/15 1425  BP: (!) 92/57 105/61  Pulse: (!) 50 61  Resp: 18 18  Temp: 97.8 F (36.6 C) 98.3 F (36.8 C)   Vitals:   11/02/15 2000 11/03/15 0119 11/03/15 0512 11/03/15 1425  BP: 97/65 (!) 93/53 (!) 92/57 105/61  Pulse: (!) 56 (!) 52 (!) 50 61  Resp:  18 18 18   Temp: 97.9 F (36.6 C) 97.6 F (36.4 C) 97.8 F (36.6 C) 98.3 F (36.8 C)  TempSrc: Oral Oral Oral Oral  SpO2:  98% 98% 98%  Weight:      Height:        General: Pt is alert, awake, not in acute distress Cardiovascular: RRR, S1/S2 +, no rubs, no gallops Respiratory: CTA bilaterally, no wheezing, no rhonchi Abdominal: Soft, NT, ND, bowel sounds + Extremities: no edema, no cyanosis    The results of significant diagnostics from this hospitalization (including imaging, microbiology, ancillary and laboratory) are listed below for reference.     Microbiology: No results found for this or any previous visit (from the past 240 hour(s)).   Labs: BNP (last 3 results) No results for input(s): BNP in the last 8760 hours. Basic Metabolic Panel:  Recent Labs Lab 11/01/15 1733 11/01/15 1754 11/02/15 0935 11/03/15 0728  NA 141 143  --  138  K 3.9 3.8  --  4.5  CL 107 104  --  106  CO2 26  --   --  24  GLUCOSE 93 92  --  88  BUN 9 11  --  13  CREATININE 0.99 1.00  --  1.00  CALCIUM 9.3  --   --  9.0  MG  --   --  2.1  --    Liver Function Tests:  Recent Labs Lab 11/01/15 1733  AST 17  ALT 14*  ALKPHOS 48  BILITOT 0.5  PROT 7.4  ALBUMIN 4.5   No results for input(s): LIPASE, AMYLASE in the last 168 hours. No results for input(s): AMMONIA in the last 168 hours. CBC:  Recent Labs Lab 11/01/15 1733 11/01/15 1754 11/03/15 0728  WBC 7.4  --  7.0  NEUTROABS 4.7  --   --   HGB 13.9 14.3 13.0  HCT 42.0 42.0 39.8   MCV 102.4*  --  102.8*  PLT 276  --  246   Cardiac Enzymes:  Recent Labs Lab 11/02/15 0935  CKTOTAL 96   BNP: Invalid input(s): POCBNP CBG:  Recent Labs Lab 11/01/15 1739  GLUCAP 86   D-Dimer No results for input(s): DDIMER in the last 72 hours. Hgb A1c No results for input(s): HGBA1C in the last 72 hours. Lipid Profile  No results for input(s): CHOL, HDL, LDLCALC, TRIG, CHOLHDL, LDLDIRECT in the last 72 hours. Thyroid function studies  Recent Labs  11/02/15 0730  TSH 1.694   Anemia work up  Recent Labs  11/02/15 0017  VITAMINB12 496  FOLATE 9.0   Urinalysis    Component Value Date/Time   COLORURINE YELLOW 05/18/2012 1512   APPEARANCEUR CLEAR 05/18/2012 1512   LABSPEC 1.032 (H) 05/18/2012 1512   PHURINE 6.0 05/18/2012 1512   GLUCOSEU 500 (A) 05/18/2012 1512   HGBUR TRACE (A) 05/18/2012 1512   BILIRUBINUR SMALL (A) 05/18/2012 1512   KETONESUR NEGATIVE 05/18/2012 1512   PROTEINUR NEGATIVE 05/18/2012 1512   UROBILINOGEN 1.0 05/18/2012 1512   NITRITE NEGATIVE 05/18/2012 1512   LEUKOCYTESUR NEGATIVE 05/18/2012 1512   Sepsis Labs Invalid input(s): PROCALCITONIN,  WBC,  LACTICIDVEN Microbiology No results found for this or any previous visit (from the past 240 hour(s)).   Time coordinating discharge: Less than 30 minutes  SIGNED:   Bennett Scrape, MD  [facility] 11/03/2015, 5:48 PM Pager [phone] If 7PM-7AM, please contact night-coverage [email or web address] Password [email or web address]

## 2015-11-03 NOTE — Evaluation (Signed)
Physical Therapy Evaluation Patient Details Name: Todd Saneaul Carlson Sr. MRN: [id number] DOB: 05/06/1970 Today's Date: 11/03/2015   History of Present Illness   Todd Saneaul Mandella Sr. is a 45 y.o. male with medical history significant for paranoid schizophrenia, peripheral neuropathy, known vitamin B12 deficiency, dyslipidemia, former history of polysubstance abuse. brought to the ER after his wife noticed he was having difficulty with altered mentation, difficulty performing ADLs and new short-term memory loss.  Clinical Impression  Pt admitted with/for above stated problems.  Pt currently limited functionally due to the problems listed below.  (see problems list.)  Pt will benefit from PT to maximize function and safety to be able to get home safely with available assist of family.  Pt at a supervision level at this time. .     Follow Up Recommendations Home health PT;Supervision for mobility/OOB    Equipment Recommendations  None recommended by PT    Recommendations for Other Services       Precautions / Restrictions Precautions Precautions: Fall Restrictions Weight Bearing Restrictions: No      Mobility  Bed Mobility Overal bed mobility: Independent                Transfers Overall transfer level: Needs assistance Equipment used: None Transfers: Sit to/from Stand Sit to Stand: Supervision         General transfer comment: mildly unsteady and halting  Ambulation/Gait Ambulation/Gait assistance: Supervision Ambulation Distance (Feet): 450 Feet Assistive device: None Gait Pattern/deviations: Step-through pattern Gait velocity: slower little ability to increase speed Gait velocity interpretation: Below normal speed for age/gender General Gait Details: pt is generally steady, but appears guarded, athough it could be bradykinetic or slowed processing.  All movements are kept under control  Stairs Stairs: Yes Stairs assistance: Supervision Stair Management: One rail  Right;Alternating pattern;Step to pattern;Forwards Number of Stairs: 4 General stair comments: safe with rail  Wheelchair Mobility    Modified Rankin (Stroke Patients Only)       Balance Overall balance assessment: Needs assistance Sitting-balance support: Feet supported;No upper extremity supported Sitting balance-Leahy Scale: Good     Standing balance support: No upper extremity supported;During functional activity Standing balance-Leahy Scale: Fair Standing balance comment: Reaching for objects to steady during functional mobility.                 Standardized Balance Assessment Standardized Balance Assessment : Dynamic Gait Index   Dynamic Gait Index Level Surface: Normal Change in Gait Speed: Mild Impairment Gait with Horizontal Head Turns: Normal Gait with Vertical Head Turns: Mild Impairment Gait and Pivot Turn: Mild Impairment Step Over Obstacle: Mild Impairment Step Around Obstacles: Moderate Impairment Steps: Mild Impairment Total Score: 17       Pertinent Vitals/Pain Pain Assessment: Faces Faces Pain Scale: Hurts little more Pain Location: knees during mobility Pain Descriptors / Indicators: Sore Pain Intervention(s): Monitored during session;Repositioned    Home Living Family/patient expects to be discharged to:: Private residence Living Arrangements: Spouse/significant other;Children;Other relatives Available Help at Discharge: Family;Available 24 hours/day Type of Home: House Home Access: Stairs to enter Entrance Stairs-Rails: Right Entrance Stairs-Number of Steps: several   Home Equipment: None      Prior Function Level of Independence: Needs assistance   Gait / Transfers Assistance Needed: independent  ADL's / Homemaking Assistance Needed: Wife reports that they try their best to get him to take care of his basic needs (they can only get him to agree to a shower once a week and he only changes his clothes  once a week)         Hand Dominance        Extremity/Trunk Assessment   Upper Extremity Assessment: Defer to OT evaluation           Lower Extremity Assessment: Overall WFL for tasks assessed (mild proximal weaknesses)         Communication   Communication: No difficulties (delayed responses)  Cognition Arousal/Alertness: Awake/alert Behavior During Therapy: Flat affect Overall Cognitive Status: Impaired/Different from baseline Area of Impairment: Following commands     Memory: Decreased short-term memory Following Commands: Follows one step commands with increased time       General Comments: Delay in responding to questions but does respond appropriately.    General Comments General comments (skin integrity, edema, etc.): DGI supports risk for falls, but I believe some of the lower score could be higher with faster processing.    Exercises     Assessment/Plan    PT Assessment Patient needs continued PT services  PT Problem List Decreased strength;Decreased activity tolerance;Decreased balance;Decreased mobility;Decreased safety awareness          PT Treatment Interventions Gait training;Stair training;Functional mobility training;Therapeutic activities;Balance training;Patient/family education    PT Goals (Current goals can be found in the Care Plan section)  Acute Rehab PT Goals Patient Stated Goal: home today after PT eval PT Goal Formulation: With patient Time For Goal Achievement: 11/10/15 Potential to Achieve Goals: Good    Frequency Min 3X/week   Barriers to discharge        Co-evaluation               End of Session   Activity Tolerance: Patient tolerated treatment well Patient left: in chair;with call bell/phone within reach;with family/visitor present Nurse Communication: Mobility status    Functional Assessment Tool Used: clinical judgement Functional Limitation: Mobility: Walking and moving around Mobility: Walking and Moving Around  Current Status (Z6109): At least 1 percent but less than 20 percent impaired, limited or restricted Mobility: Walking and Moving Around Goal Status [phone]): At least 1 percent but less than 20 percent impaired, limited or restricted    Time: 0981-1914 PT Time Calculation (min) (ACUTE ONLY): 34 min   Charges:   PT Evaluation $PT Eval Moderate Complexity: 1 Procedure PT Treatments $Gait Training: 8-22 mins   PT G Codes:   PT G-Codes **NOT FOR INPATIENT CLASS** Functional Assessment Tool Used: clinical judgement Functional Limitation: Mobility: Walking and moving around Mobility: Walking and Moving Around Current Status (N8295): At least 1 percent but less than 20 percent impaired, limited or restricted Mobility: Walking and Moving Around Goal Status [phone]): At least 1 percent but less than 20 percent impaired, limited or restricted    Kacey Vicuna, Eliseo Gum 11/03/2015, 3:46 PM  11/03/2015  [place], PT [phone]  (pager)

## 2015-11-03 NOTE — Progress Notes (Signed)
Patient's f/u neuropsych made by family for Oct 6th at 1:15.

## 2015-11-03 NOTE — Consult Note (Signed)
[facility] Face-to-Face Psychiatry Consult   Reason for Consult:  Medication induced parkinsonian features Referring Physician:  Dr,. Jearld Shines Patient Identification: Todd Eber Sr. MRN:  [id number] Principal Diagnosis: Coarse tremors Diagnosis:   Patient Active Problem List   Diagnosis Date Noted  . Parkinsonian features [R25.9] 11/02/2015  . Altered mental status [R41.82] 11/02/2015  . Coarse tremors [G25.2] 11/02/2015  . HLD (hyperlipidemia) [E78.5] 11/02/2015  . History of Polysubstance abuse [F19.10] 11/02/2015  . Altered mental state [R41.82]   . Macrocytic anemia [D53.9]   . GERD [K21.9] 01/03/2010  . COUGH [R05] 01/03/2010  . FATIGUE [R53.81, R53.83] 09/28/2009  . ABDOMINAL PAIN, EPIGASTRIC [R10.13] 07/15/2009  . SUPRAPUBIC PAIN [R10.9] 07/15/2009  . DENTAL CARIES [K02.9] 05/12/2009  . TOBACCO ABUSE [F17.200] 06/15/2008  . SINUSITIS [J32.9] 06/15/2008  . PARANOID SCHIZOPHRENIA [F20.0] 04/23/2008  . OTITIS EXTERNA [H60.399] 04/26/2007  . KNEE PAIN, BILATERAL [M25.569] 12/21/2006  . 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 Adjustment disorder with mixed anxiety and depressed mood [F43.23] 10/10/2006  . Peroneal neuropathy [G57.30] 10/10/2006  . B12 deficiency [E53.8] 07/17/2006    Total Time spent with patient: 45 minutes  Subjective:   Todd Buffalo Sr. is a 45 y.o. male patient admitted with AMS and parkinsonian feautres.  HPI:  Todd Buckhalter Sr. is a 45 y.o. male with medical history significant for paranoid schizophrenia followed by Dr. Darleene Cleaver, peripheral neuropathy, known vitamin B12 deficiency, dyslipidemia, former history of polysubstance abuse. Patient seen, chart reviewed for this face-to-face psychiatric consultation and evaluation of parkinsonism symptoms secondary to antipsychotic medications over the last 2 years. Patient wife is at bedside reported patient has been slowly deteriorated his psychomotor function secondary to medication induced extrapyramidal  symptoms. Patient primary psychiatrist recommended admission to the medical floor and possible neurologic evaluation. Patient has complete neurological evaluation which is negative for Neuro pathology. Patient appeared staying in his bed, awake, alert, oriented to time place person and situation. Patient has decreased psychomotor activity, motor slowness and slow verbal response. Patient was observed ability to walk out of his bed and also able to walk into his bed without assistance. Patient denied disturbance of sleep or appetite with his current medications. Patient wife is concerned about how he is going to maintain these because he does not remember taking his medication on time and he was given monthly long-acting injections in the past. Patient also has a polysubstance abuse, alcohol dependence and currently in remission over 2 years. Patient has a chronic B12 deficiency which indicated supplementation. Patient continued to have paranoid thoughts but denied behaviors, irritability, agitation, suicidal/homicidal ideation and hallucinations.   Past Psychiatric History: Paranoid schizophrenia and polysubstance abuse and his last psychiatric admission was at [facility]   in 2014 for catatonia and psychosis.   Risk to Self: Is patient at risk for suicide?: No, but patient needs Medical Clearance Risk to Others:   Prior Inpatient Therapy:   Prior Outpatient Therapy:    Past Medical History:  Past Medical History:  Diagnosis Date  . History of ETOH abuse   . Neuropathy (Ruth)   . Paranoid schizophrenia (Ronda)   . Polysubstance abuse   . Vitamin B 12 deficiency    History reviewed. No pertinent surgical history. Family History:  Family History  Problem Relation Age of Onset  . Adopted: Yes   Family Psychiatric  History: Not contributory Social History:  History  Alcohol Use No    Comment: HIstory of cocaine abuse     History  Drug Use No  Comment: history of cocane abuse    Social  History   Social History  . Marital status: Married    Spouse name: N/A  . Number of children: N/A  . Years of education: N/A   Social History Main Topics  . Smoking status: Current Every Day Smoker    Packs/day: 2.00  . Smokeless tobacco: Never Used  . Alcohol use No     Comment: HIstory of cocaine abuse  . Drug use: No     Comment: history of cocane abuse  . Sexual activity: Not Asked   Other Topics Concern  . None   Social History Narrative  . None   Additional Social History:    Allergies:  No Known Allergies  Labs:  Results for orders placed or performed during the hospital encounter of 11/01/15 (from the past 48 hour(s))  Protime-INR     Status: None   Collection Time: 11/01/15  5:33 PM  Result Value Ref Range   Prothrombin Time 13.1 11.4 - 15.2 seconds   INR 0.99   APTT     Status: None   Collection Time: 11/01/15  5:33 PM  Result Value Ref Range   aPTT 30 24 - 36 seconds  CBC     Status: Abnormal   Collection Time: 11/01/15  5:33 PM  Result Value Ref Range   WBC 7.4 4.0 - 10.5 K/uL   RBC 4.10 (L) 4.22 - 5.81 MIL/uL   Hemoglobin 13.9 13.0 - 17.0 g/dL   HCT 42.0 39.0 - 52.0 %   MCV 102.4 (H) 78.0 - 100.0 fL   MCH 33.9 26.0 - 34.0 pg   MCHC 33.1 30.0 - 36.0 g/dL   RDW 13.0 11.5 - 15.5 %   Platelets 276 150 - 400 K/uL  Differential     Status: None   Collection Time: 11/01/15  5:33 PM  Result Value Ref Range   Neutrophils Relative % 64 %   Neutro Abs 4.7 1.7 - 7.7 K/uL   Lymphocytes Relative 29 %   Lymphs Abs 2.1 0.7 - 4.0 K/uL   Monocytes Relative 6 %   Monocytes Absolute 0.5 0.1 - 1.0 K/uL   Eosinophils Relative 1 %   Eosinophils Absolute 0.0 0.0 - 0.7 K/uL   Basophils Relative 0 %   Basophils Absolute 0.0 0.0 - 0.1 K/uL  Comprehensive metabolic panel     Status: Abnormal   Collection Time: 11/01/15  5:33 PM  Result Value Ref Range   Sodium 141 135 - 145 mmol/L   Potassium 3.9 3.5 - 5.1 mmol/L   Chloride 107 101 - 111 mmol/L   CO2 26 22 -  32 mmol/L   Glucose, Bld 93 65 - 99 mg/dL   BUN 9 6 - 20 mg/dL   Creatinine, Ser 0.99 0.61 - 1.24 mg/dL   Calcium 9.3 8.9 - 10.3 mg/dL   Total Protein 7.4 6.5 - 8.1 g/dL   Albumin 4.5 3.5 - 5.0 g/dL   AST 17 15 - 41 U/L   ALT 14 (L) 17 - 63 U/L   Alkaline Phosphatase 48 38 - 126 U/L   Total Bilirubin 0.5 0.3 - 1.2 mg/dL   GFR calc non Af Amer >60 >60 mL/min   GFR calc Af Amer >60 >60 mL/min    Comment: (NOTE) The eGFR has been calculated using the CKD EPI equation. This calculation has not been validated in all clinical situations. eGFR's persistently <60 mL/min signify possible Chronic Kidney Disease.  Anion gap 8 5 - 15  CBG monitoring, ED     Status: None   Collection Time: 11/01/15  5:39 PM  Result Value Ref Range   Glucose-Capillary 86 65 - 99 mg/dL  I-stat troponin, ED     Status: None   Collection Time: 11/01/15  5:53 PM  Result Value Ref Range   Troponin i, poc 0.00 0.00 - 0.08 ng/mL   Comment 3            Comment: Due to the release kinetics of cTnI, a negative result within the first hours of the onset of symptoms does not rule out myocardial infarction with certainty. If myocardial infarction is still suspected, repeat the test at appropriate intervals.   I-Stat Chem 8, ED     Status: Abnormal   Collection Time: 11/01/15  5:54 PM  Result Value Ref Range   Sodium 143 135 - 145 mmol/L   Potassium 3.8 3.5 - 5.1 mmol/L   Chloride 104 101 - 111 mmol/L   BUN 11 6 - 20 mg/dL   Creatinine, Ser 1.00 0.61 - 1.24 mg/dL   Glucose, Bld 92 65 - 99 mg/dL   Calcium, Ion 1.08 (L) 1.15 - 1.40 mmol/L   TCO2 27 0 - 100 mmol/L   Hemoglobin 14.3 13.0 - 17.0 g/dL   HCT 42.0 39.0 - 52.0 %  Vitamin B12     Status: None   Collection Time: 11/02/15 12:17 AM  Result Value Ref Range   Vitamin B-12 496 180 - 914 pg/mL    Comment: (NOTE) This assay is not validated for testing neonatal or myeloproliferative syndrome specimens for Vitamin B12 levels.   Folate     Status: None    Collection Time: 11/02/15 12:17 AM  Result Value Ref Range   Folate 9.0 >5.9 ng/mL  TSH     Status: None   Collection Time: 11/02/15  7:30 AM  Result Value Ref Range   TSH 1.694 0.350 - 4.500 uIU/mL  RPR     Status: None   Collection Time: 11/02/15  7:30 AM  Result Value Ref Range   RPR Ser Ql Non Reactive Non Reactive    Comment: (NOTE) Performed At: [facility] [place], [place] [id number] Lindon Romp MD TD:[id number]   Magnesium     Status: None   Collection Time: 11/02/15  9:35 AM  Result Value Ref Range   Magnesium 2.1 1.7 - 2.4 mg/dL  CK     Status: None   Collection Time: 11/02/15  9:35 AM  Result Value Ref Range   Total CK 96 49 - 397 U/L  Basic metabolic panel     Status: None   Collection Time: 11/03/15  7:28 AM  Result Value Ref Range   Sodium 138 135 - 145 mmol/L   Potassium 4.5 3.5 - 5.1 mmol/L    Comment: HEMOLYSIS AT THIS LEVEL MAY AFFECT RESULT   Chloride 106 101 - 111 mmol/L   CO2 24 22 - 32 mmol/L   Glucose, Bld 88 65 - 99 mg/dL   BUN 13 6 - 20 mg/dL   Creatinine, Ser 1.00 0.61 - 1.24 mg/dL   Calcium 9.0 8.9 - 10.3 mg/dL   GFR calc non Af Amer >60 >60 mL/min   GFR calc Af Amer >60 >60 mL/min    Comment: (NOTE) The eGFR has been calculated using the CKD EPI equation. This calculation has not been validated in all clinical situations. eGFR's persistently <60  mL/min signify possible Chronic Kidney Disease.    Anion gap 8 5 - 15  CBC     Status: Abnormal   Collection Time: 11/03/15  7:28 AM  Result Value Ref Range   WBC 7.0 4.0 - 10.5 K/uL   RBC 3.87 (L) 4.22 - 5.81 MIL/uL   Hemoglobin 13.0 13.0 - 17.0 g/dL   HCT 39.8 39.0 - 52.0 %   MCV 102.8 (H) 78.0 - 100.0 fL   MCH 33.6 26.0 - 34.0 pg   MCHC 32.7 30.0 - 36.0 g/dL   RDW 13.4 11.5 - 15.5 %   Platelets 246 150 - 400 K/uL    Current Facility-Administered Medications  Medication Dose Route Frequency Provider Last Rate Last Dose  . acetaminophen (TYLENOL) tablet 650  mg  650 mg Oral Q6H PRN Samella Parr, NP       Or  . acetaminophen (TYLENOL) suppository 650 mg  650 mg Rectal Q6H PRN Samella Parr, NP      . acyclovir (ZOVIRAX) tablet 400 mg  400 mg Oral BID Samella Parr, NP   400 mg at 11/03/15 1042  . benztropine (COGENTIN) tablet 1 mg  1 mg Oral BID Samella Parr, NP   1 mg at 11/03/15 1041  . busPIRone (BUSPAR) tablet 15 mg  15 mg Oral TID Samella Parr, NP   15 mg at 11/03/15 1041  . citalopram (CELEXA) tablet 20 mg  20 mg Oral QHS Samella Parr, NP   20 mg at 11/02/15 2333  . enoxaparin (LOVENOX) injection 40 mg  40 mg Subcutaneous Q24H Samella Parr, NP   40 mg at 11/03/15 1041  . gabapentin (NEURONTIN) capsule 100 mg  100 mg Oral TID Samella Parr, NP   100 mg at 11/03/15 1041  . loratadine (CLARITIN) tablet 10 mg  10 mg Oral Daily Waldemar Dickens, MD   10 mg at 11/03/15 1042  . LORazepam (ATIVAN) injection 1 mg  1 mg Intravenous Once Samella Parr, NP      . OLANZapine [facility]) tablet 5 mg  5 mg Oral QHS Samella Parr, NP   5 mg at 11/02/15 2333  . pantoprazole (PROTONIX) EC tablet 40 mg  40 mg Oral Daily Samella Parr, NP   40 mg at 11/03/15 1042  . pravastatin (PRAVACHOL) tablet 40 mg  40 mg Oral q1800 Samella Parr, NP   40 mg at 11/02/15 1744  . pregabalin (LYRICA) capsule 75 mg  75 mg Oral Daily Samella Parr, NP   75 mg at 11/02/15 2337  . sodium chloride flush (NS) 0.9 % injection 3 mL  3 mL Intravenous Q12H Samella Parr, NP   3 mL at 11/02/15 2338  . traMADol (ULTRAM) tablet 50 mg  50 mg Oral Q6H PRN Samella Parr, NP      . vitamin B-12 (CYANOCOBALAMIN) tablet 1,000 mcg  1,000 mcg Oral Daily Samella Parr, NP   1,000 mcg at 11/03/15 1042    Musculoskeletal: Strength & Muscle Tone: decreased Gait & Station: slow motor activity Patient leans: N/A  Psychiatric Specialty Exam: Physical Exam as per history and physical   ROS patient has decreased psychomotor activity but denied nausea vomiting, abdominal  pain, shortness of breath and chest pain. No Fever-chills, No Headache, No changes with Vision or hearing, reports vertigo No problems swallowing food or Liquids, No Chest pain, Cough or Shortness of Breath, No Abdominal pain, No Nausea or  Vommitting, Bowel movements are regular, No Blood in stool or Urine, No dysuria, No new skin rashes or bruises, No new joints pains-aches,  No new weakness, tingling, numbness in any extremity, No recent weight gain or loss, No polyuria, polydypsia or polyphagia,   A full 10 point Review of Systems was done, except as stated above, all other Review of Systems were negative.  Blood pressure (!) 92/57, pulse (!) 50, temperature 97.8 F (36.6 C), temperature source Oral, resp. rate 18, height 6' (1.829 m), weight 82.7 kg (182 lb 6 oz), SpO2 98 %.Body mass index is 24.73 kg/m.  General Appearance: Disheveled  Eye Contact:  Good  Speech:  Clear and Coherent  Volume:  Decreased  Mood:  Depressed  Affect:  Constricted and Depressed  Thought Process:  Coherent and Goal Directed  Orientation:  Full (Time, Place, and Person)  Thought Content:  Paranoid Ideation  Suicidal Thoughts:  No  Homicidal Thoughts:  No  Memory:  Immediate;   Good Recent;   Fair  Judgement:  Intact  Insight:  Fair  Psychomotor Activity:  Psychomotor Retardation  Concentration:  Concentration: Fair and Attention Span: Fair  Recall:  [employer] of Knowledge:  Fair  Language:  Good  Akathisia:  Negative  Handed:  Right  AIMS (if indicated):     Assets:  Communication Skills Desire for Improvement Financial Resources/Insurance Housing Intimacy Leisure Time Resilience Social Support Transportation  ADL's:  Impaired  Cognition:  Impaired,  Mild  Sleep:        Treatment Plan Summary: 45 years old male with the history of chronic paranoid schizophrenia, polysubstance abuse including alcohol dependence currently in remission admitted with altered mental status and  parkinsonian symptoms secondary to long-acting injectable of antipsychotic medication over 2 years. Patient continued to have mild paranoia but no behaviors, no agitation or aggressive behaviors no suicidal homicidal ideation.  Patient has no safety concerns at this time  Medication management: Recommended Abilify 5 mg twice daily for the next 10 days and then may participate in Abilify to maintain a 400 international units monthly injections, which is better option for this patient because of partially agonizes dopamine D2, and serotonin 5 HT 1A receptors, antagonizes 5 HT 1A receptors which leads to less extrapyramidal symptoms.   which has less extrapyramidal symptoms   Discontinue Invega long-acting injections Continue benztropine 1 mg twice daily to prevent Parkinson's symptoms or EPS Continue Zyprexa 5 mg at bedtime for paranoia and insomnia Continue Celexa 20 mg daily and also BuSpar 15 mg 3 times daily for generalized anxiety  Patient benefit from B12 injections monthly secondary to chronic B12 deficiency Continue gabapentin and Lyrica for neuropathic pain   Daily contact with patient to assess and evaluate symptoms and progress in treatment and Medication management  Disposition: No evidence of imminent risk to self or others at present.   Patient does not meet criteria for psychiatric inpatient admission. Supportive therapy provided about ongoing stressors. Refer to IOP. or partial hospitalization at [facility] when able tolerate.  Ambrose Finland, MD 11/03/2015 12:37 PM

## 2015-11-21 ENCOUNTER — Encounter: Payer: Self-pay | Admitting: Neurology

## 2015-11-23 ENCOUNTER — Ambulatory Visit: Payer: Medicare Other | Admitting: Neurology

## 2017-11-01 IMAGING — MR MR HEAD WO/W CM
10 of 12 series · 33 of 48 positions shown · IV contrast (multihance)
Comparison: CT HEAD November 01, 2015 and MRI of the head July 08, 2012

CLINICAL DATA: Altered mental status and tremor. History of
polysubstance abuse, schizophrenia.

EXAM:
MRI HEAD WITHOUT AND WITH CONTRAST
TECHNIQUE: Multiplanar, multiecho pulse sequences of the brain and surrounding
structures were obtained without and with intravenous contrast.
CONTRAST:  18mL MULTIHANCE GADOBENATE DIMEGLUMINE 529 MG/ML IV SOLN

[Series 3: DWI · axial · 3.0mm · 0.94mm/px · z∈[-43,+101]mm · 7 of 98 slices shown (1 of 2)]
[im 1/98]
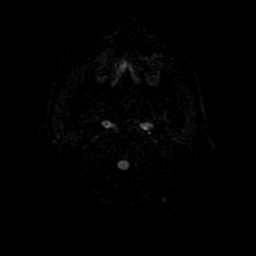
[im 17/98]
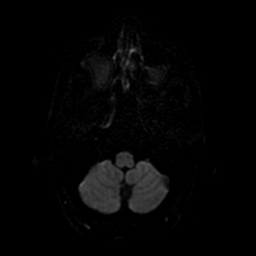
[im 33/98]
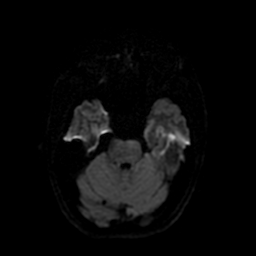
[im 49/98]
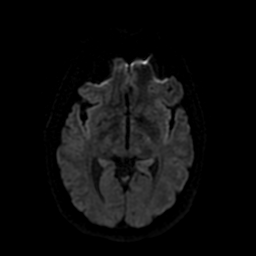
[im 65/98]
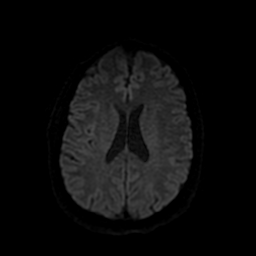
[im 81/98]
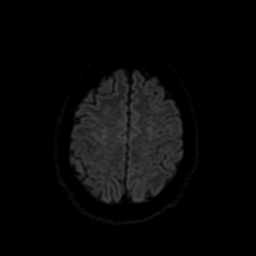
[im 98/98]
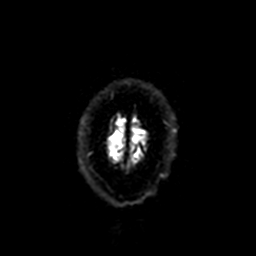

[Series 4: T2 · axial · 5.0mm · 0.47mm/px · z∈[-43,+101]mm · 2 of 25 slices shown]
[im 1/25]
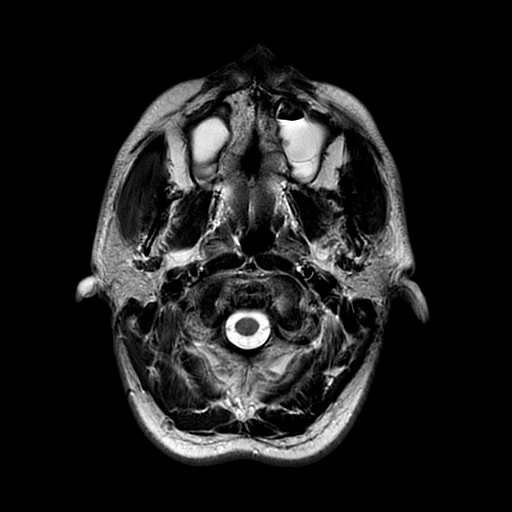
[im 25/25]
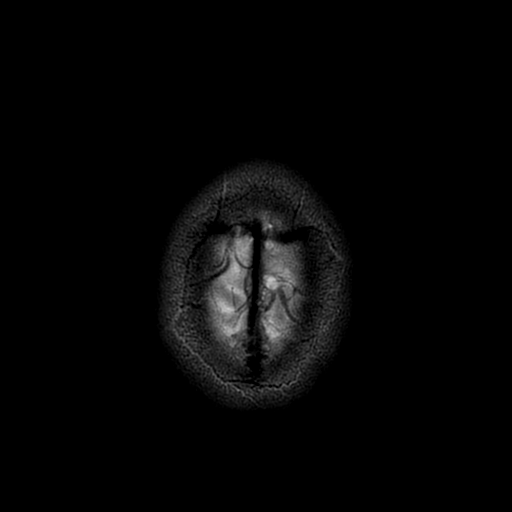

[Series 5: FLAIR · axial · 5.0mm · 0.47mm/px · z∈[-43,+101]mm · 2 of 25 slices shown (1 of 2)]
[im 1/25]
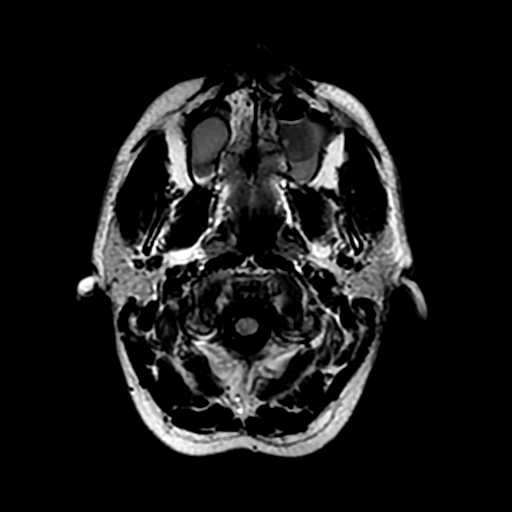
[im 25/25]
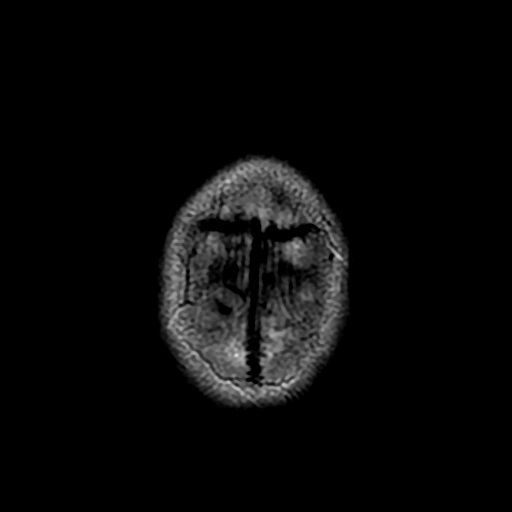

[Series 6: FLAIR · sagittal · 5.0mm · 0.47mm/px · 2 of 23 slices shown (2 of 2)]
[im 1/23]
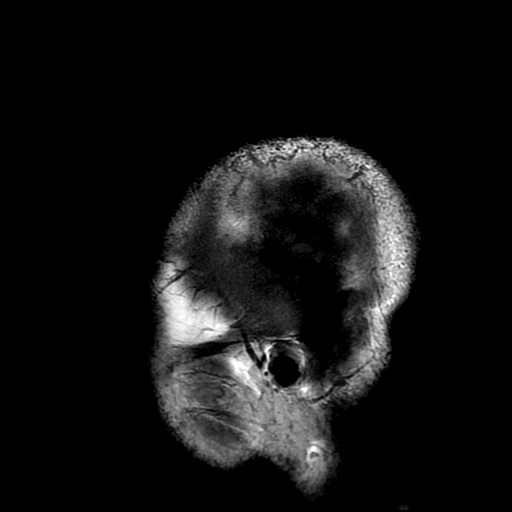
[im 23/23]
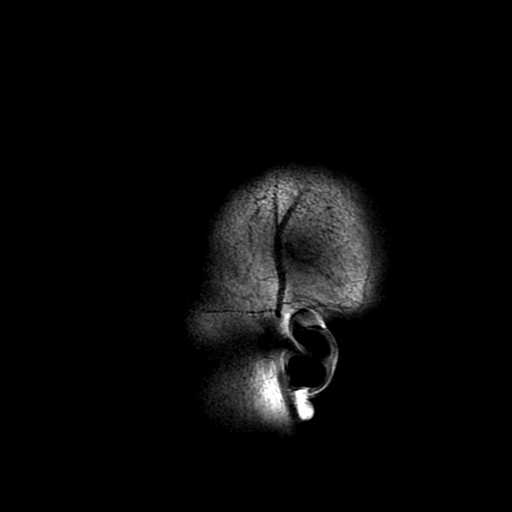

[Series 7: DWI · coronal · 4.0mm · 0.94mm/px · 6 of 70 slices shown (2 of 2)]
[im 1/70]
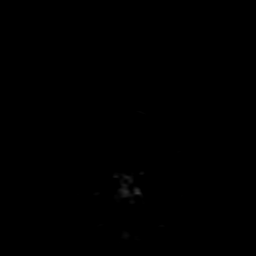
[im 14/70]
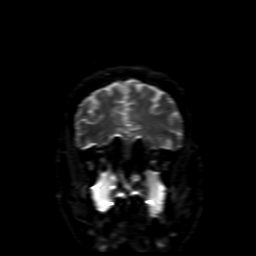
[im 28/70]
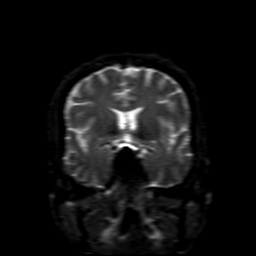
[im 42/70]
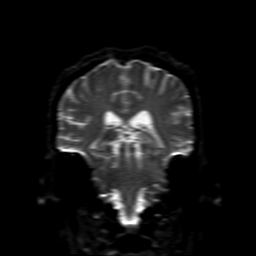
[im 56/70]
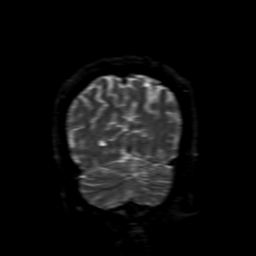
[im 70/70]
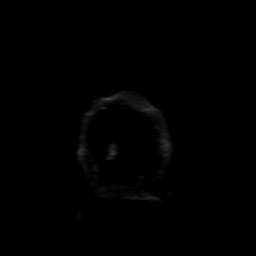

[Series 8: (person_name) · axial · 3.0mm · 0.47mm/px · 1 of 100 slices shown]
[im 1/100]
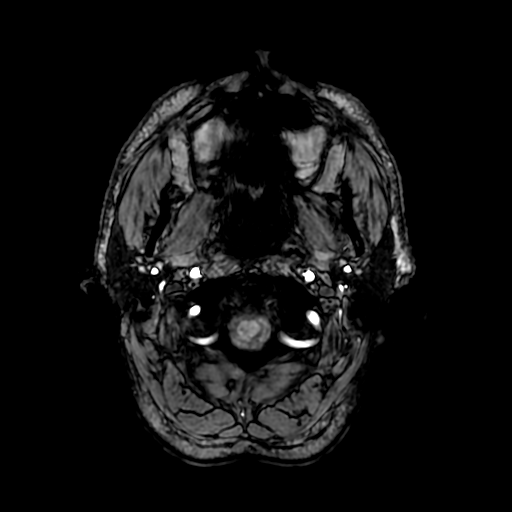

[Series 10: T2 post-contrast · coronal · 4.0mm · 0.47mm/px · 3 of 35 slices shown]
[im 1/35]
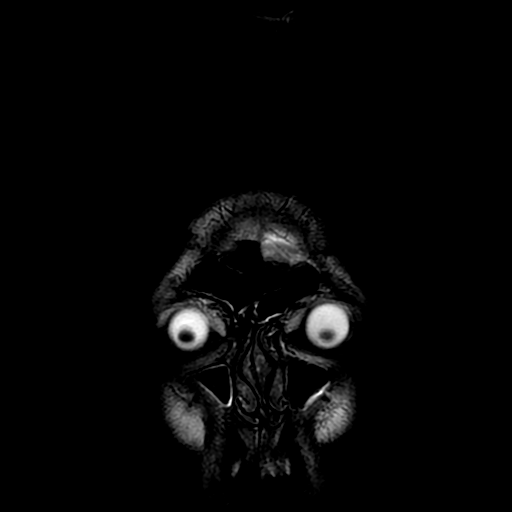
[im 18/35]
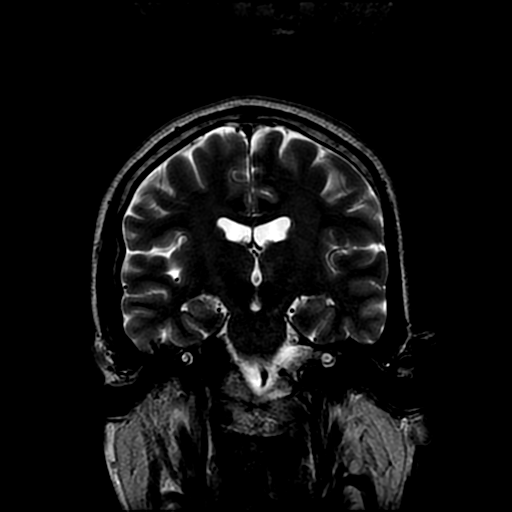
[im 35/35]
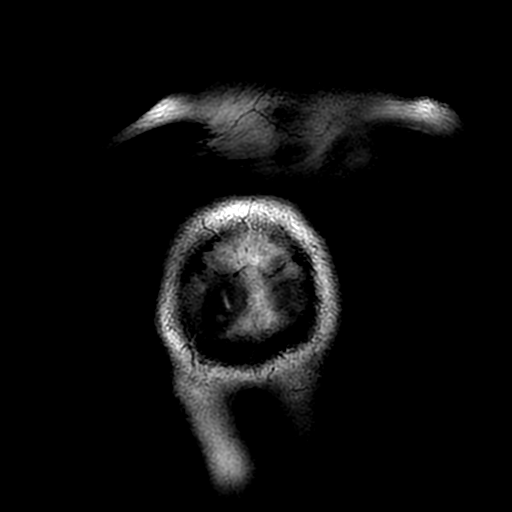

[Series 12: T1 · coronal · 4.0mm · 0.47mm/px · 3 of 35 slices shown]
[im 1/35]
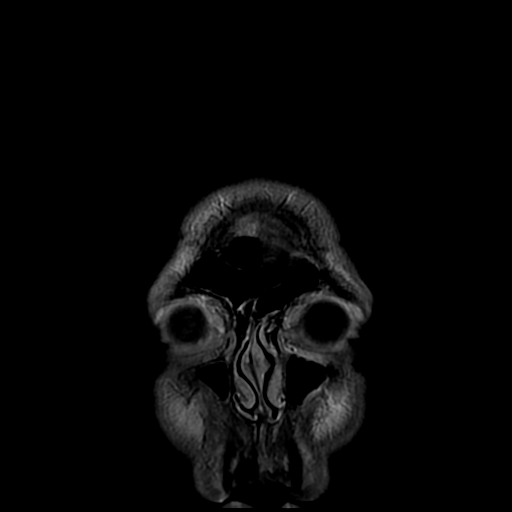
[im 18/35]
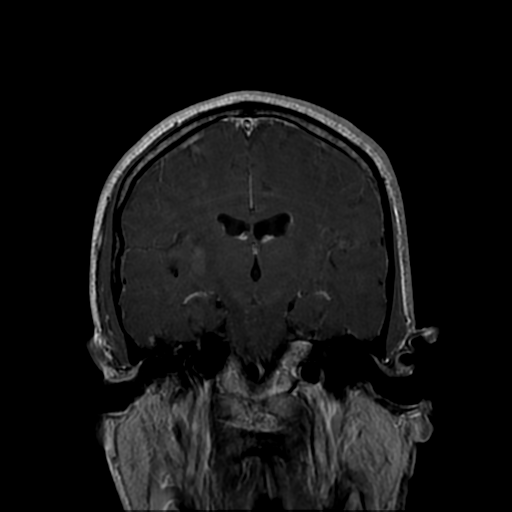
[im 35/35]
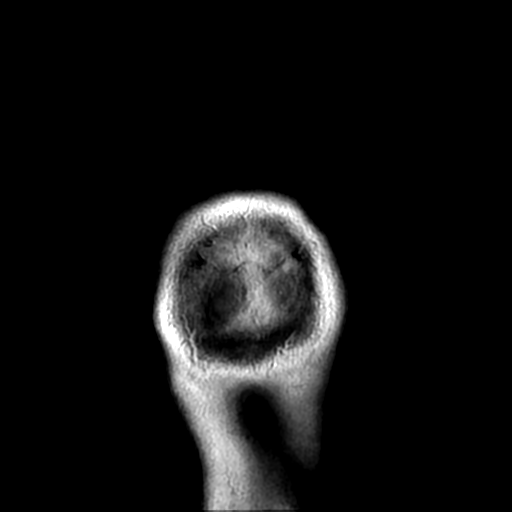

[Series 350: ADC · axial · 3.0mm · 0.94mm/px · z∈[-43,+101]mm · 4 of 49 slices shown (1 of 2)]
[im 1/49]
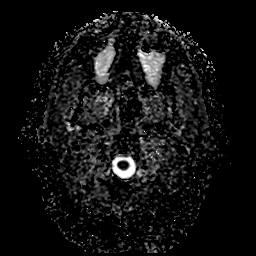
[im 17/49]
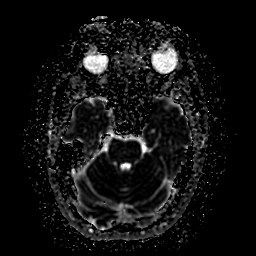
[im 33/49]
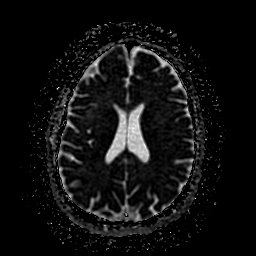
[im 49/49]
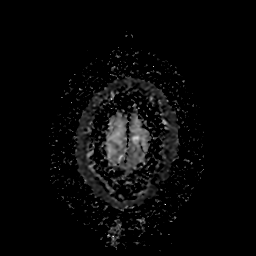

[Series 750: ADC · coronal · 4.0mm · 0.94mm/px · 3 of 35 slices shown (2 of 2)]
[im 1/35]
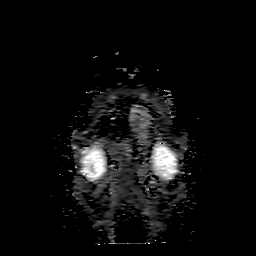
[im 18/35]
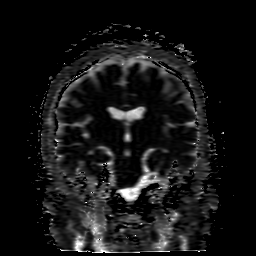
[im 35/35]
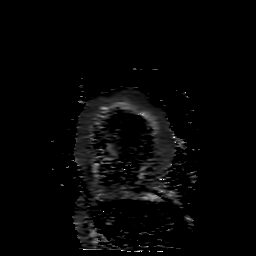

[33 of 48 positions shown; findings below may reference images not displayed]

FINDINGS: INTRACRANIAL CONTENTS: No reduced diffusion to suggest acute
ischemia. No susceptibility artifact to suggest hemorrhage. The
ventricles and sulci are normal for patient's age. No suspicious
parenchymal signal, mass lesions, mass effect. No abnormal
intraparenchymal or extra-axial enhancement. No abnormal extra-axial
fluid collections. No extra-axial masses. Normal major intracranial
vascular flow voids present at skull base.

ORBITS: The included ocular globes and orbital contents are
non-suspicious.

SINUSES: Large bilateral maxillary mucosal retention cyst.

SKULL/SOFT TISSUES: No abnormal sellar expansion. No suspicious
calvarial bone marrow signal. Craniocervical junction maintained.
IMPRESSION: Negative MRI head with and without contrast.

## 2017-12-17 NOTE — Pre-Procedure Instructions (Signed)
Todd SanePaul Creed Sr.  12/17/2017      [id number] [place] - [address] [place] [address] Phone: [phone] Fax: [phone]  [facility] [place] - [address] [place] [address] Phone: [phone] Fax: [phone]  [facility] [place] - [address] [place] [address] Phone: [phone] Fax: [phone]    Your procedure is scheduled on Thurs., Nov. 21, 2019 from 9:30AM-10:40AM  [facility] "A" at 7:30AM  Call this number if you have problems the morning of surgery:  [phone]   Remember:  Do not eat or drink after midnight on Nov. 20th    Take these medicines the morning of surgery with A SIP OF WATER: Acyclovir (ZOVIRAX), ARIPiprazole (ABILIFY), Benztropine (COGENTIN), BusPIRone (BUSPAR), Gabapentin (NEURONTIN), Pantoprazole (PROTONIX), and Pregabalin (LYRICA)  If needed: HydrOXYzine (VISTARIL) and TraMADol (ULTRAM)  As of today, stop taking all Other Aspirin Products, Vitamins, Fish oils, and Herbal medications. Also stop all NSAIDS i.e. Advil, Ibuprofen, Motrin, Aleve, Anaprox, Naproxen, BC, Goody Powders, and all Supplements.   Do not wear jewelry.  Do not wear lotions, powders, colognes, or deodorant.  Do not shave 48 hours prior to surgery.  Men may shave face.  Do not bring valuables to the hospital.  [facility] is not responsible for any belongings or valuables.  Contacts, dentures or bridgework may not be worn into surgery.  Leave your suitcase in the car.  After surgery it may be brought to your room.  For patients admitted to the hospital, discharge time will be determined by your treatment team.  Patients discharged the day of surgery will not be allowed to drive home.   Special instructions: [facility]- Preparing For Surgery  Before surgery, you can play  an important role. Because skin is not sterile, your skin needs to be as free of germs as possible. You can reduce the number of germs on your skin by washing with CHG (chlorahexidine gluconate) Soap before surgery.  CHG is an antiseptic cleaner which kills germs and bonds with the skin to continue killing germs even after washing.    Oral Hygiene is also important to reduce your risk of infection.  Remember - BRUSH YOUR TEETH THE MORNING OF SURGERY WITH YOUR REGULAR TOOTHPASTE  Please do not use if you have an allergy to CHG or antibacterial soaps. If your skin becomes reddened/irritated stop using the CHG.  Do not shave (including legs and underarms) for at least 48 hours prior to first CHG shower. It is OK to shave your face.  Please follow these instructions carefully.   1. Shower the NIGHT BEFORE SURGERY and the MORNING OF SURGERY with CHG.   2. If you chose to wash your hair, wash your hair first as usual with your normal shampoo.  3. After you shampoo, rinse your hair and body thoroughly to remove the shampoo.  4. Use CHG as you would any other liquid soap. You can apply CHG directly to the skin and wash gently with a scrungie or a clean washcloth.   5. Apply the CHG Soap to your body ONLY FROM THE NECK DOWN.  Do not use on open wounds or open sores. Avoid contact with your eyes, ears, mouth and genitals (private parts). Wash Face and genitals (private parts)  with your normal soap.  6. Wash thoroughly, paying special attention to the  area where your surgery will be performed.  7. Thoroughly rinse your body with warm water from the neck down.  8. DO NOT shower/wash with your normal soap after using and rinsing off the CHG Soap.  9. Pat yourself dry with a CLEAN TOWEL.  10. Wear CLEAN PAJAMAS to bed the night before surgery, wear comfortable clothes the morning of surgery  11. Place CLEAN SHEETS on your bed the night of your first shower and DO NOT SLEEP WITH PETS.  Day of  Surgery:  Do not apply any deodorants/lotions.  Please wear clean clothes to the hospital/surgery center.   Remember to brush your teeth WITH YOUR REGULAR TOOTHPASTE.  Please read over the following fact sheets that you were given. Pain Booklet, Coughing and Deep Breathing and Surgical Site Infection Prevention

## 2017-12-18 ENCOUNTER — Other Ambulatory Visit (HOSPITAL_COMMUNITY): Payer: Self-pay

## 2017-12-18 ENCOUNTER — Encounter (HOSPITAL_COMMUNITY)
Admission: RE | Admit: 2017-12-18 | Discharge: 2017-12-18 | Disposition: A | Discharge status: Home / Self Care | Payer: Managed Care, Other (non HMO) | Source: Ambulatory Visit | Attending: Otolaryngology | Admitting: Otolaryngology

## 2017-12-18 ENCOUNTER — Other Ambulatory Visit: Payer: Self-pay

## 2017-12-18 ENCOUNTER — Encounter (HOSPITAL_COMMUNITY): Payer: Self-pay

## 2017-12-18 DIAGNOSIS — Z01812 Encounter for preprocedural laboratory examination: Secondary | ICD-10-CM | POA: Insufficient documentation

## 2017-12-18 HISTORY — DX: Depression, unspecified: F32.A

## 2017-12-18 HISTORY — DX: Major depressive disorder, single episode, unspecified: F32.9

## 2017-12-18 HISTORY — DX: Gastro-esophageal reflux disease without esophagitis: K21.9

## 2017-12-18 HISTORY — DX: Polyneuropathy, unspecified: G62.9

## 2017-12-18 LAB — CBC
HCT: 39.2 % (ref 39.0–52.0)
Hemoglobin: 12.7 g/dL — ABNORMAL LOW (ref 13.0–17.0)
MCH: 33.1 pg (ref 26.0–34.0)
MCHC: 32.4 g/dL (ref 30.0–36.0)
MCV: 102.1 fL — ABNORMAL HIGH (ref 80.0–100.0)
Platelets: 270 10*3/uL (ref 150–400)
RBC: 3.84 MIL/uL — ABNORMAL LOW (ref 4.22–5.81)
RDW: 12.7 % (ref 11.5–15.5)
WBC: 8.4 10*3/uL (ref 4.0–10.5)
nRBC: 0 % (ref 0.0–0.2)

## 2017-12-18 NOTE — Progress Notes (Addendum)
PCP: Tracey Harriesavid Bouska, MD  Cardiologist: pt denies  EKG: denies past year  Stress test: pt denies  ECHO: pt denies  Cardiac Cath: pt denies  Chest x-ray: denies past year, no recent respiratory infections/complications

## 2017-12-20 ENCOUNTER — Ambulatory Visit (HOSPITAL_COMMUNITY): Payer: Managed Care, Other (non HMO) | Admitting: Certified Registered"

## 2017-12-20 ENCOUNTER — Other Ambulatory Visit: Payer: Self-pay

## 2017-12-20 ENCOUNTER — Encounter (HOSPITAL_COMMUNITY)
Admission: RE | Disposition: A | Discharge status: Home / Self Care | Payer: Self-pay | Source: Ambulatory Visit | Attending: Otolaryngology

## 2017-12-20 ENCOUNTER — Ambulatory Visit (HOSPITAL_COMMUNITY)
Admission: RE | Admit: 2017-12-20 | Discharge: 2017-12-20 | Disposition: A | Discharge status: Home / Self Care | Payer: Managed Care, Other (non HMO) | Source: Ambulatory Visit | Attending: Otolaryngology | Admitting: Otolaryngology

## 2017-12-20 ENCOUNTER — Encounter (HOSPITAL_COMMUNITY): Payer: Self-pay

## 2017-12-20 ENCOUNTER — Other Ambulatory Visit: Payer: Self-pay | Admitting: Otolaryngology

## 2017-12-20 DIAGNOSIS — G629 Polyneuropathy, unspecified: Secondary | ICD-10-CM | POA: Insufficient documentation

## 2017-12-20 DIAGNOSIS — X58XXXA Exposure to other specified factors, initial encounter: Secondary | ICD-10-CM | POA: Diagnosis not present

## 2017-12-20 DIAGNOSIS — F17210 Nicotine dependence, cigarettes, uncomplicated: Secondary | ICD-10-CM | POA: Diagnosis not present

## 2017-12-20 DIAGNOSIS — F200 Paranoid schizophrenia: Secondary | ICD-10-CM | POA: Insufficient documentation

## 2017-12-20 DIAGNOSIS — Z79899 Other long term (current) drug therapy: Secondary | ICD-10-CM | POA: Insufficient documentation

## 2017-12-20 DIAGNOSIS — S00432A Contusion of left ear, initial encounter: Secondary | ICD-10-CM | POA: Insufficient documentation

## 2017-12-20 DIAGNOSIS — K219 Gastro-esophageal reflux disease without esophagitis: Secondary | ICD-10-CM | POA: Diagnosis not present

## 2017-12-20 DIAGNOSIS — F329 Major depressive disorder, single episode, unspecified: Secondary | ICD-10-CM | POA: Diagnosis not present

## 2017-12-20 HISTORY — PX: HEMATOMA EVACUATION: SHX5118

## 2017-12-20 SURGERY — EVACUATION HEMATOMA
Anesthesia: General | Site: Ear | Laterality: Left

## 2017-12-20 MED ORDER — OXYCODONE HCL 5 MG PO TABS: 5.0000 mg | ORAL_TABLET | Freq: Once | ORAL | Status: DC | PRN

## 2017-12-20 MED ORDER — LIDOCAINE 2% (20 MG/ML) 5 ML SYRINGE
INTRAMUSCULAR | Status: AC
  Filled 2017-12-20: qty 5

## 2017-12-20 MED ORDER — OXYCODONE HCL 5 MG/5ML PO SOLN: 5.0000 mg | Freq: Once | ORAL | Status: DC | PRN

## 2017-12-20 MED ORDER — ONDANSETRON HCL 4 MG/2ML IJ SOLN
INTRAMUSCULAR | Status: DC | PRN
  Administered 2017-12-20: 4 mg via INTRAVENOUS

## 2017-12-20 MED ORDER — BACITRACIN ZINC 500 UNIT/GM EX OINT
TOPICAL_OINTMENT | CUTANEOUS | Status: AC
  Filled 2017-12-20: qty 28.35

## 2017-12-20 MED ORDER — BACITRACIN ZINC 500 UNIT/GM EX OINT
TOPICAL_OINTMENT | CUTANEOUS | Status: DC | PRN
  Administered 2017-12-20: 1 via TOPICAL

## 2017-12-20 MED ORDER — LACTATED RINGERS IV SOLN
INTRAVENOUS | Status: DC
  Administered 2017-12-20: 08:00:00 via INTRAVENOUS

## 2017-12-20 MED ORDER — PROPOFOL 10 MG/ML IV BOLUS
INTRAVENOUS | Status: DC | PRN
  Administered 2017-12-20: 100 mg via INTRAVENOUS

## 2017-12-20 MED ORDER — PROPOFOL 10 MG/ML IV BOLUS
INTRAVENOUS | Status: AC
  Filled 2017-12-20: qty 20

## 2017-12-20 MED ORDER — FENTANYL CITRATE (PF) 100 MCG/2ML IJ SOLN
INTRAMUSCULAR | Status: DC | PRN
  Administered 2017-12-20: 50 ug via INTRAVENOUS

## 2017-12-20 MED ORDER — MIDAZOLAM HCL 2 MG/2ML IJ SOLN
INTRAMUSCULAR | Status: AC
  Filled 2017-12-20: qty 2

## 2017-12-20 MED ORDER — PROMETHAZINE HCL 25 MG/ML IJ SOLN: 6.2500 mg | INTRAMUSCULAR | Status: DC | PRN

## 2017-12-20 MED ORDER — LIDOCAINE-EPINEPHRINE 1 %-1:100000 IJ SOLN
INTRAMUSCULAR | Status: AC
  Filled 2017-12-20: qty 1

## 2017-12-20 MED ORDER — CEPHALEXIN 500 MG PO CAPS: 500.0000 mg | ORAL_CAPSULE | Freq: Three times a day (TID) | ORAL | 0 refills | Status: AC

## 2017-12-20 MED ORDER — LIDOCAINE 2% (20 MG/ML) 5 ML SYRINGE
INTRAMUSCULAR | Status: DC | PRN
  Administered 2017-12-20: 40 mg via INTRAVENOUS

## 2017-12-20 MED ORDER — FENTANYL CITRATE (PF) 250 MCG/5ML IJ SOLN
INTRAMUSCULAR | Status: AC
  Filled 2017-12-20: qty 5

## 2017-12-20 MED ORDER — MIDAZOLAM HCL 5 MG/5ML IJ SOLN
INTRAMUSCULAR | Status: DC | PRN
  Administered 2017-12-20: 2 mg via INTRAVENOUS

## 2017-12-20 MED ORDER — HYDROMORPHONE HCL 1 MG/ML IJ SOLN: 0.2500 mg | INTRAMUSCULAR | Status: DC | PRN

## 2017-12-20 MED ORDER — DEXAMETHASONE SODIUM PHOSPHATE 10 MG/ML IJ SOLN
INTRAMUSCULAR | Status: DC | PRN
  Administered 2017-12-20: 10 mg via INTRAVENOUS

## 2017-12-20 SURGICAL SUPPLY — 49 items
AIRSTRIP 4 3/4X3 1/4 7185 (GAUZE/BANDAGES/DRESSINGS) IMPLANT
ATTRACTOMAT 16X20 MAGNETIC DRP (DRAPES) IMPLANT
BLADE SURG 10 STRL SS (BLADE) ×2 IMPLANT
BLADE SURG 15 STRL LF DISP TIS (BLADE) IMPLANT
BLADE SURG 15 STRL SS (BLADE)
BNDG CONFORM 2 STRL LF (GAUZE/BANDAGES/DRESSINGS) IMPLANT
BNDG GAUZE ELAST 4 BULKY (GAUZE/BANDAGES/DRESSINGS) IMPLANT
CANISTER SUCT 3000ML PPV (MISCELLANEOUS) IMPLANT
CATH ROBINSON RED A/P 16FR (CATHETERS) IMPLANT
CLEANER TIP ELECTROSURG 2X2 (MISCELLANEOUS) ×2 IMPLANT
CONT SPEC 4OZ CLIKSEAL STRL BL (MISCELLANEOUS) ×2 IMPLANT
COVER SURGICAL LIGHT HANDLE (MISCELLANEOUS) ×2 IMPLANT
COVER WAND RF STERILE (DRAPES) ×2 IMPLANT
CRADLE DONUT ADULT HEAD (MISCELLANEOUS) IMPLANT
DRAIN PENROSE 1/4X12 LTX STRL (WOUND CARE) IMPLANT
DRAPE HALF SHEET 40X57 (DRAPES) IMPLANT
DRSG EMULSION OIL 3X3 NADH (GAUZE/BANDAGES/DRESSINGS) IMPLANT
ELECT COATED BLADE 2.86 ST (ELECTRODE) ×2 IMPLANT
ELECT NEEDLE TIP 2.8 STRL (NEEDLE) IMPLANT
ELECT REM PT RETURN 9FT ADLT (ELECTROSURGICAL) ×2
ELECTRODE REM PT RTRN 9FT ADLT (ELECTROSURGICAL) ×1 IMPLANT
GAUZE 4X4 16PLY RFD (DISPOSABLE) IMPLANT
GAUZE SPONGE 4X4 12PLY STRL (GAUZE/BANDAGES/DRESSINGS) ×2 IMPLANT
GLOVE ECLIPSE 7.5 STRL STRAW (GLOVE) ×2 IMPLANT
GOWN STRL REUS W/ TWL LRG LVL3 (GOWN DISPOSABLE) ×2 IMPLANT
GOWN STRL REUS W/TWL LRG LVL3 (GOWN DISPOSABLE) ×2
KIT BASIN OR (CUSTOM PROCEDURE TRAY) ×2 IMPLANT
KIT TURNOVER KIT B (KITS) ×2 IMPLANT
NEEDLE 25GX 5/8IN NON SAFETY (NEEDLE) IMPLANT
NEEDLE HYPO 25GX1X1/2 BEV (NEEDLE) IMPLANT
NS IRRIG 1000ML POUR BTL (IV SOLUTION) ×2 IMPLANT
PAD ARMBOARD 7.5X6 YLW CONV (MISCELLANEOUS) ×4 IMPLANT
PENCIL FOOT CONTROL (ELECTRODE) ×2 IMPLANT
POUCH STERILIZING 3 X22 (STERILIZATION PRODUCTS) IMPLANT
SUT CHROMIC 4 0 P 3 18 (SUTURE) ×2 IMPLANT
SUT ETHILON 4 0 PS 2 18 (SUTURE) ×2 IMPLANT
SUT ETHILON 5 0 P 3 18 (SUTURE) ×1
SUT NYLON ETHILON 5-0 P-3 1X18 (SUTURE) ×1 IMPLANT
SUT PLAIN 4 0 ~~LOC~~ 1 (SUTURE) ×2 IMPLANT
SUT SILK 4 0 (SUTURE) ×1
SUT SILK 4-0 18XBRD TIE 12 (SUTURE) ×1 IMPLANT
SWAB COLLECTION DEVICE MRSA (MISCELLANEOUS) IMPLANT
SWAB CULTURE ESWAB REG 1ML (MISCELLANEOUS) IMPLANT
SYR BULB IRRIGATION 50ML (SYRINGE) IMPLANT
SYR TB 1ML LUER SLIP (SYRINGE) IMPLANT
TOWEL OR 17X24 6PK STRL BLUE (TOWEL DISPOSABLE) ×2 IMPLANT
TRAY ENT MC OR (CUSTOM PROCEDURE TRAY) ×2 IMPLANT
WATER STERILE IRR 1000ML POUR (IV SOLUTION) ×2 IMPLANT
YANKAUER SUCT BULB TIP NO VENT (SUCTIONS) IMPLANT

## 2017-12-20 NOTE — Anesthesia Preprocedure Evaluation (Addendum)
Anesthesia Evaluation  Patient identified by MRN, date of birth, ID band Patient awake    Reviewed: Allergy & Precautions, NPO status , Patient's Chart, lab work & pertinent test results  Airway Mallampati: III  TM Distance: >3 FB Neck ROM: Full    Dental  (+) Lower Dentures, Edentulous Upper   Pulmonary Current Smoker,    Pulmonary exam normal breath sounds clear to auscultation       Cardiovascular negative cardio ROS Normal cardiovascular exam Rhythm:Regular Rate:Normal     Neuro/Psych PSYCHIATRIC DISORDERS Depression Schizophrenia Neuropathy    GI/Hepatic GERD  Medicated and Controlled,(+)     substance abuse  ,   Endo/Other  negative endocrine ROS  Renal/GU negative Renal ROS     Musculoskeletal negative musculoskeletal ROS (+)   Abdominal   Peds  Hematology  (+) anemia , HLD   Anesthesia Other Findings Hematoma of left ear  Reproductive/Obstetrics                            Anesthesia Physical Anesthesia Plan  ASA: III  Anesthesia Plan: General   Post-op Pain Management:    Induction: Intravenous  PONV Risk Score and Plan: 1 and Ondansetron, Dexamethasone and Treatment may vary due to age or medical condition  Airway Management Planned: Oral ETT and LMA  Additional Equipment:   Intra-op Plan:   Post-operative Plan: Extubation in OR  Informed Consent: I have reviewed the patients History and Physical, chart, labs and discussed the procedure including the risks, benefits and alternatives for the proposed anesthesia with the patient or authorized representative who has indicated his/her understanding and acceptance.   Dental advisory given  Plan Discussed with: CRNA  Anesthesia Plan Comments:        Anesthesia Quick Evaluation

## 2017-12-20 NOTE — Anesthesia Postprocedure Evaluation (Signed)
Anesthesia Post Note  Patient: Elisabeth Mostaul D Trigueros Sr.  Procedure(s) Performed: EVACUATION HEMATOMA (Left Ear)     Patient location during evaluation: PACU Anesthesia Type: General Level of consciousness: awake Pain management: pain level controlled Vital Signs Assessment: post-procedure vital signs reviewed and stable Respiratory status: spontaneous breathing, nonlabored ventilation, respiratory function stable and patient connected to nasal cannula oxygen Cardiovascular status: blood pressure returned to baseline and stable Postop Assessment: no apparent nausea or vomiting Anesthetic complications: no    Last Vitals:  Vitals:   12/20/17 1122 12/20/17 1128  BP:  110/66  Pulse:  73  Resp:  15  Temp: 36.5 C   SpO2:  100%    Last Pain:  Vitals:   12/20/17 1128  TempSrc:   PainSc: 0-No pain                 Ryan P Ellender

## 2017-12-20 NOTE — Op Note (Signed)
Preop/postop diagnosis: Left auricular hematoma Procedure: Incision and drainage with quilting stitch left hematoma Anesthesia: general Estimated blood loss: Less than 5 mL Indications: This 47 year old with a swelling of the left auricular region that seem to come up spontaneously a month ago. He's not had any response to medical therapy. He now is ready to proceed with quilting stitch technique.he is informed risk and benefits of the procedure and options were discussed all questions are answered and consent was obtained. He does understand there will be pin cushion-like spots on his pinna some of which could be permanent.  Procedure: Patient is taken aberrantly supine position after LMA anesthesia was placed in the right gaze position prepped and draped in usual sterile manner. The incision was made in the antihelix region and a serous bloody fluid was expressed from the large auricular hematoma. It was dissected with a hemostat open up all areas. There is no purulence. A quilting stitch was then placed in this. Her lateral concha and along the antihelix and scaphoid fossa. This secured the skin back down to the cartilage. There was reasonable hemostasis. The patient was then awakened brought to recovery in stable condition counts correct

## 2017-12-20 NOTE — H&P (Signed)
Todd HOWATT Sr. is an 47 y.o. male.   Chief Complaint: ear swelling HPI: hx of 6 weeks of a left ear hematoma. He has no pain. He is ready for I/D  Past Medical History:  Diagnosis Date  . Depression   . GERD (gastroesophageal reflux disease)   . History of ETOH abuse   . Neuropathy   . Paranoid schizophrenia (HCC)   . Peripheral neuropathy   . Polysubstance abuse (HCC)   . Vitamin B 12 deficiency     Past Surgical History:  Procedure Laterality Date  . COLONOSCOPY    . MULTIPLE TOOTH EXTRACTIONS  2018/2019    Family History  Adopted: Yes   Social History:  reports that he has been smoking. He has been smoking about 2.00 packs per day. He has never used smokeless tobacco. He reports that he does not drink alcohol or use drugs.  Allergies: No Known Allergies  Medications Prior to Admission  Medication Sig Dispense Refill  . acyclovir (ZOVIRAX) 400 MG tablet Take 400 mg by mouth 2 (two) times daily.    . ARIPiprazole (ABILIFY) 5 MG tablet Take 1 tablet (5 mg total) by mouth 2 (two) times daily. 20 tablet 0  . benztropine (COGENTIN) 1 MG tablet Take 1 tablet (1 mg total) by mouth 2 (two) times daily. 60 tablet 0  . busPIRone (BUSPAR) 10 MG tablet Take 10 mg by mouth 3 (three) times daily.     . citalopram (CELEXA) 20 MG tablet Take 20 mg by mouth at bedtime.    . hydrOXYzine (VISTARIL) 25 MG capsule Take 1 capsule (25 mg total) by mouth every 8 (eight) hours as needed for anxiety. 30 capsule 0  . lovastatin (MEVACOR) 40 MG tablet Take 40 mg by mouth at bedtime.    Marland Kitchen OLANZapine (ZYPREXA) 10 MG tablet Take 10 mg by mouth at bedtime.     . pantoprazole (PROTONIX) 40 MG tablet Take 40 mg by mouth every morning.    . pregabalin (LYRICA) 50 MG capsule Take 75 mg by mouth daily.     . tamsulosin (FLOMAX) 0.4 MG CAPS capsule Take 0.4 mg by mouth 2 (two) times daily.    . vitamin B-12 (CYANOCOBALAMIN) 1000 MCG tablet Take 1,000 mcg by mouth daily.    . traMADol (ULTRAM) 50 MG tablet  Take 1 tablet (50 mg total) by mouth every 8 (eight) hours as needed for pain. (Patient not taking: Reported on 12/18/2017) 18 tablet 0    Results for orders placed or performed during the hospital encounter of 12/18/17 (from the past 48 hour(s))  CBC     Status: Abnormal   Collection Time: 12/18/17  2:23 PM  Result Value Ref Range   WBC 8.4 4.0 - 10.5 K/uL   RBC 3.84 (L) 4.22 - 5.81 MIL/uL   Hemoglobin 12.7 (L) 13.0 - 17.0 g/dL   HCT 40.9 81.1 - 91.4 %   MCV 102.1 (H) 80.0 - 100.0 fL   MCH 33.1 26.0 - 34.0 pg   MCHC 32.4 30.0 - 36.0 g/dL   RDW 78.2 95.6 - 21.3 %   Platelets 270 150 - 400 K/uL   nRBC 0.0 0.0 - 0.2 %    Comment: Performed at [facility], [address] [place], [place] [address]   No results found.  Review of Systems  Constitutional: Negative.   HENT: Negative.   Eyes: Negative.   Respiratory: Negative.   Cardiovascular: Negative.   Skin: Negative.  Blood pressure (!) 107/57, pulse 63, temperature 98 F (36.7 C), temperature source Oral, resp. rate 20, height 5\' 11"  (1.803 m), weight 78.7 kg, SpO2 99 %. Physical Exam  Constitutional: He appears well-developed.  HENT:  Head: Normocephalic and atraumatic.  Nose: Nose normal.  Mouth/Throat: Oropharynx is clear and moist.  Eyes: Pupils are equal, round, and reactive to light.  Neck: Normal range of motion. Neck supple.  Cardiovascular: Normal rate.  Respiratory: Effort normal.  GI: Soft.  Musculoskeletal: Normal range of motion.     Assessment/Plan Left auricular hematoma- discussed procedure and ready to proceed  Todd ObeyJohn Rook Maue, MD 12/20/2017, 9:59 AM

## 2017-12-20 NOTE — Transfer of Care (Signed)
Immediate Anesthesia Transfer of Care Note  Patient: Todd Mostaul D Adduci Sr.  Procedure(s) Performed: EVACUATION HEMATOMA (Left Ear)  Patient Location: PACU  Anesthesia Type:General  Level of Consciousness: drowsy and patient cooperative  Airway & Oxygen Therapy: Patient Spontanous Breathing and Patient connected to face mask oxygen  Post-op Assessment: Report given to RN and Post -op Vital signs reviewed and stable  Post vital signs: Reviewed and stable  Last Vitals:  Vitals Value Taken Time  BP 108/70 12/20/2017 10:53 AM  Temp 36.5 C 12/20/2017 10:54 AM  Pulse 60 12/20/2017 10:59 AM  Resp 14 12/20/2017 10:59 AM  SpO2 100 % 12/20/2017 10:59 AM  Vitals shown include unvalidated device data.  Last Pain:  Vitals:   12/20/17 1054  TempSrc:   PainSc: 0-No pain         Complications: No apparent anesthesia complications

## 2017-12-20 NOTE — Anesthesia Procedure Notes (Signed)
Procedure Name: LMA Insertion Date/Time: 12/20/2017 10:31 AM Performed by: Marny Lowensteinapozzi, Dink Creps W, CRNA Pre-anesthesia Checklist: Patient identified, Emergency Drugs available, Suction available and Patient being monitored Patient Re-evaluated:Patient Re-evaluated prior to induction Oxygen Delivery Method: Circle system utilized Preoxygenation: Pre-oxygenation with 100% oxygen Induction Type: IV induction Ventilation: Mask ventilation without difficulty LMA: LMA with gastric port inserted LMA Size: 4.0 Number of attempts: 1 Placement Confirmation: positive ETCO2 and breath sounds checked- equal and bilateral Tube secured with: Tape Dental Injury: Teeth and Oropharynx as per pre-operative assessment

## 2017-12-21 ENCOUNTER — Encounter (HOSPITAL_COMMUNITY): Payer: Self-pay | Admitting: Otolaryngology

## 2020-04-01 ENCOUNTER — Ambulatory Visit (HOSPITAL_BASED_OUTPATIENT_CLINIC_OR_DEPARTMENT_OTHER)
Admission: RE | Admit: 2020-04-01 | Discharge: 2020-04-01 | Disposition: A | Discharge status: Home / Self Care | Payer: Managed Care, Other (non HMO) | Source: Ambulatory Visit | Attending: Family Medicine | Admitting: Family Medicine

## 2020-04-01 ENCOUNTER — Other Ambulatory Visit (HOSPITAL_BASED_OUTPATIENT_CLINIC_OR_DEPARTMENT_OTHER): Payer: Self-pay | Admitting: Family Medicine

## 2020-04-01 ENCOUNTER — Other Ambulatory Visit: Payer: Self-pay

## 2020-04-01 DIAGNOSIS — M79672 Pain in left foot: Secondary | ICD-10-CM

## 2021-05-26 ENCOUNTER — Ambulatory Visit (HOSPITAL_COMMUNITY)
Admission: EM | Admit: 2021-05-26 | Discharge: 2021-05-26 | Disposition: A | Discharge status: Home / Self Care | Payer: 59 | Attending: Psychiatry | Admitting: Psychiatry

## 2021-05-26 DIAGNOSIS — Z760 Encounter for issue of repeat prescription: Secondary | ICD-10-CM

## 2021-05-26 DIAGNOSIS — Z91148 Patient's other noncompliance with medication regimen for other reason: Secondary | ICD-10-CM

## 2021-05-26 DIAGNOSIS — F411 Generalized anxiety disorder: Secondary | ICD-10-CM | POA: Diagnosis not present

## 2021-05-26 MED ORDER — OLANZAPINE 10 MG PO TABS
10.0000 mg | ORAL_TABLET | Freq: Every day | ORAL | Status: DC
  Administered 2021-05-26: 10 mg via ORAL

## 2021-05-26 MED ORDER — OLANZAPINE 10 MG PO TABS
10.0000 mg | ORAL_TABLET | Freq: Every day | ORAL | Status: DC
  Filled 2021-05-26: qty 1

## 2021-05-26 NOTE — BH Assessment (Signed)
Pt looking to have his medications bridged until he can see his new provider on 5/31. Pt reports he has been taking old medications from 2021-2022 and he has not seen a provider in about 6 months to a year. Patient reporting heart palpitations and a little pressure in his chest. Pt denies SI, HI, AVH. Was referred by provider office. ? ?Pt is routine.  ?

## 2021-05-26 NOTE — Discharge Instructions (Addendum)
Pt to f/u with out patient psychiatry ?

## 2021-05-26 NOTE — ED Provider Notes (Signed)
Behavioral Health Urgent Care Medical Screening Exam ? ?Patient Name: Todd Winters. ?MRN: [id number] ?Date of Evaluation: 05/27/21 ?Chief Complaint:   ?Diagnosis:  ?Final diagnoses:  ?Encounter for medication refill  ?Non compliance w medication regimen  ?Generalized anxiety disorder  ? ? ?History of Present illness: Todd Winters. is a 51 y.o. male. Present to [facility] with his wife, trying to get medication refil,  per the patient he was seeing a psychiatrist who no long in business, according to the patient, he was taking the medication on and off.  Pt has a diagnosis of Paranoid schizophrenia, depression and anxiety. ?Pt medication list includes.  Abilify 5 mg, Cogentin 1 mg, olanzapine 10 mg, buspar 10 mg,  celexa 20 mg.   ? ? ?Observation of patient,  he is alert and oriented x 4,  speech clear,  maintain good eye contact.  Denies SI,  HI,  AVH or paranoia.  Discus with patient I could only refill his Zyprexa 10 mg daily for 7 days.  Due to the monitoring nature of patient medication he need to be establish by a psych provider so his medication can be restarted and manage. Pt will need to call back the provider office that send him to  [facility] and try to get an earlier appointment. ? ?Recommend discharge with instruction to f/u with outpatient psychiatry  ? ?Psychiatric Specialty Exam ? ?Presentation  ?General Appearance:Appropriate for Environment ? ?Eye Contact:Good ? ?Speech:Clear and Coherent ? ?Speech Volume:Normal ? ?Handedness:Ambidextrous ? ? ?Mood and Affect  ?Mood:Anxious ? ?Affect:Appropriate ? ? ?Thought Process  ?Thought Processes:Coherent ? ?Descriptions of Associations:Circumstantial ? ?Orientation:Full (Time, Place and Person) ? ?Thought Content:Abstract Reasoning ?   Hallucinations:None ? ?Ideas of Reference:None ? ?Suicidal Thoughts:No ? ?Homicidal Thoughts:No ? ? ?Sensorium  ?Memory:Immediate Good ? ?Judgment:Fair ? ?Insight:Fair ? ? ?Executive Functions  ?Concentration:Fair ? ?Attention  Span:Fair ? ?Recall:Fair ? ?Fund of Knowledge:Fair ? ?Language:Fair ? ? ?Psychomotor Activity  ?Psychomotor Activity:Normal ? ? ?Assets  ?Assets:No data recorded ? ?Sleep  ?Sleep:Fair ? ?Number of hours: No data recorded ? ?Nutritional Assessment (For OBS and FBC admissions only) ?Has the patient had a weight loss or gain of 10 pounds or more in the last 3 months?: No ?Has the patient had a decrease in food intake/or appetite?: No ?Does the patient have dental problems?: No ?Does the patient have eating habits or behaviors that may be indicators of an eating disorder including binging or inducing vomiting?: No ?Has the patient recently lost weight without trying?: 0 ?Has the patient been eating poorly because of a decreased appetite?: 0 ?Malnutrition Screening Tool Score: 0 ? ? ? ?Physical Exam: ?Physical Exam ?HENT:  ?   Head: Normocephalic.  ?   Nose: Nose normal.  ?Cardiovascular:  ?   Rate and Rhythm: Normal rate.  ?Pulmonary:  ?   Effort: Pulmonary effort is normal.  ?Musculoskeletal:     ?   General: Normal range of motion.  ?   Cervical back: Normal range of motion.  ?Skin: ?   General: Skin is warm.  ?Neurological:  ?   General: No focal deficit present.  ?   Mental Status: He is alert.  ?Psychiatric:     ?   Mood and Affect: Mood normal.     ?   Thought Content: Thought content normal.  ? ?Review of Systems  ?Constitutional: Negative.   ?HENT: Negative.    ?Eyes: Negative.   ?Respiratory: Negative.    ?Cardiovascular: Negative.   ?  Gastrointestinal: Negative.   ?Genitourinary: Negative.   ?Musculoskeletal: Negative.   ?Skin: Negative.   ?Neurological: Negative.   ?Endo/Heme/Allergies: Negative.   ?Psychiatric/Behavioral:  The patient is nervous/anxious.   ?There were no vitals taken for this visit. There is no height or weight on file to calculate BMI. ? ?Musculoskeletal: ?Strength & Muscle Tone: within normal limits ?Gait & Station: normal ?Patient leans: N/A ? ? ?[facility] Discharge Disposition for Follow  up and Recommendations: ?Pt to f/u with out patient psychiatry for medication refil ? ?Meds ordered this encounter  ?Medications  ? DISCONTD: OLANZapine (ZYPREXA) tablet 10 mg  ? DISCONTD: OLANZapine (ZYPREXA) tablet 10 mg  ?  ? ? ?Sindy Guadeloupe, NP ?05/27/2021, 4:49 AM ? ?

## 2021-05-27 NOTE — ED Provider Notes (Addendum)
Patient's wife Mrs. Meng Mendizabal called asking to speak with this provider regarding patient's medication. Mrs Deshazer report that patient was seem here at [facility] last night by Evette Georges and patient was to be prescribed Zyprexa 10mg  but when she went to pharmacy she was informed that no prescription was sent.  ? ?This provider reviewed patient's chart; per Earnest Rosier, NP "Observation of patient,  he is alert and oriented x 4,  speech clear,  maintain good eye contact.  Denies SI,  HI,  AVH or paranoia.  Discus with patient I could only refill his Zyprexa 10 mg daily for 7 days.  Due to the monitoring nature of patient medication he need to be establish by a psych provider so his medication can be restarted and manage. Pt will need to call back the provider office that send him to  [facility] and try to get an earlier appointment." ? ?Prescription for Zyprexa 10mg /day called in to [facility] on [address] [phone]. Informed patient's wife that prescription was called in and for her to contact [facility] for pick up time.  ?

## 2023-11-21 ENCOUNTER — Encounter (HOSPITAL_COMMUNITY): Payer: Self-pay

## 2023-11-21 ENCOUNTER — Emergency Department (HOSPITAL_COMMUNITY)
Admission: EM | Admit: 2023-11-21 | Discharge: 2023-11-21 | Disposition: A | Discharge status: Home / Self Care | Attending: Emergency Medicine | Admitting: Emergency Medicine

## 2023-11-21 DIAGNOSIS — H5712 Ocular pain, left eye: Secondary | ICD-10-CM | POA: Diagnosis present

## 2023-11-21 DIAGNOSIS — H5789 Other specified disorders of eye and adnexa: Secondary | ICD-10-CM | POA: Diagnosis not present

## 2023-11-21 MED ORDER — TETRACAINE HCL 0.5 % OP SOLN
2.0000 [drp] | Freq: Once | OPHTHALMIC | Status: AC
  Administered 2023-11-21: 2 [drp] via OPHTHALMIC
  Filled 2023-11-21: qty 4

## 2023-11-21 NOTE — Discharge Instructions (Addendum)
 Continue following up with your ophthalmologist.

## 2023-11-21 NOTE — ED Notes (Signed)
 Pt with both eye got to 20/32 Pt with right eye covering left got to 20/40 Pt with left eye coving right was unable to see anything but stated all he could see was pink

## 2023-11-21 NOTE — ED Provider Triage Note (Addendum)
 Emergency Medicine Provider Triage Evaluation Note  Todd MUSSA Sr. , a 53 y.o. male  was evaluated in triage.  Pt complains of pain in the left eye and blurry vision.  He was sent in by his ophthalmologist for admission for strict medication regimen.  He is coming from Dr. Craige office.  Dr. Fleeta is on-call for this office.  They have paperwork with them that communicates this plan. Injury occurred last week.  Symptoms not improved since then.  Review of Systems  Positive: As above Negative: As above  Physical Exam  BP 130/88   Pulse 93   Temp 98.4 F (36.9 C) (Oral)   Resp 20   SpO2 97%  Gen:   Awake, no distress   Resp:  Normal effort  MSK:   Moves extremities without difficulty  Other:    Medical Decision Making  Medically screening exam initiated at 2:13 PM.  Appropriate orders placed.  Todd JONETTA Banter Sr. was informed that the remainder of the evaluation will be completed by another provider, this initial triage assessment does not replace that evaluation, and the importance of remaining in the ED until their evaluation is complete.  Nursing made aware patient needs room asap.   Todd Loge, PA-C 11/21/23 1414    Todd Loge, PA-C 11/21/23 1414

## 2023-11-21 NOTE — ED Provider Notes (Signed)
 [place] EMERGENCY DEPARTMENT AT [facility] Provider Note   CSN: [id number] Arrival date & time: 11/21/23  1359     Patient presents with: No chief complaint on file.   Todd RAINERI Sr. is a 53 y.o. male.   HPI  Pt complains of pain in the left eye and blurry vision.  He was sent in by his ophthalmologist for admission for medication management because it seems that patient is having poor compliance with his eyedrops at home.  He is coming from Dr. Craige office.  Dr. Fleeta is on-call for this office.  They have paperwork with them that communicates this plan. 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   It seems that in the office today patient's left eye at a pressure of 44 and right eye had a pressure of 18.  He was sent to the emergency department because of medication noncompliance due to memory issues which seem to be chronic and unchanged in this patient's life.  He is with his wife who indicates to me that she has been trying to stay on top of giving him his eyedrops but that she struggles to do so because she is dealing with a pregnant daughter and a son who has Marfan syndrome.       Prior to Admission medications   Medication Sig Start Date End Date Taking? Authorizing Provider  acyclovir  (ZOVIRAX ) 400 MG tablet Take 400 mg by mouth 2 (two) times daily.    [provider]  ARIPiprazole  (ABILIFY ) 5 MG tablet Take 1 tablet (5 mg total) by mouth 2 (two) times daily. 11/03/15 12/18/17  Berkeley Adelita PENNER, MD  benztropine  (COGENTIN ) 1 MG tablet Take 1 tablet (1 mg total) by mouth 2 (two) times daily. 11/03/15   [place]keley Adelita PENNER, MD  busPIRone  (BUSPAR ) 10 MG tablet Take 10 mg by mouth 3 (three) times daily.  06/18/12   [provider]  cephALEXin  (KEFLEX ) 500 MG  capsule Take 1 capsule (500 mg total) by mouth 3 (three) times daily. 12/20/17   Roark Rush, MD  citalopram  (CELEXA ) 20 MG tablet Take 20 mg by mouth at bedtime. 06/18/12   [provider]  hydrOXYzine  (VISTARIL ) 25 MG capsule Take 1 capsule (25 mg total) by mouth every 8 (eight) hours as needed for anxiety. 11/03/15   Berkeley Adelita PENNER, MD  lovastatin (MEVACOR) 40 MG tablet Take 40 mg by mouth at bedtime.    [provider]  OLANZapine  (ZYPREXA ) 10 MG tablet Take 10 mg by mouth at bedtime.  06/18/12   [provider]  pantoprazole  (PROTONIX ) 40 MG tablet Take 40 mg by mouth every morning.    [provider]  pregabalin  (LYRICA ) 50 MG capsule Take 75 mg by mouth daily.     [provider]  tamsulosin (FLOMAX) 0.4 MG CAPS capsule Take 0.4 mg by mouth 2 (two) times daily. 07/17/17   [provider]  traMADol  (ULTRAM ) 50 MG tablet Take 1 tablet (50 mg total) by mouth every 8 (eight) hours as needed for pain. Patient not taking: Reported on 12/18/2017 09/18/12   Terryl Kubas, NP  vitamin B-12 (CYANOCOBALAMIN ) 1000 MCG tablet Take 1,000 mcg by mouth daily.    [provider]    Allergies: Patient has no known allergies.  Review of Systems  Updated Vital Signs BP 108/78 (BP Location: Right Arm)   Pulse 71   Temp 98.2 F (36.8 C) (Oral)   Resp 18   SpO2 100%   Physical Exam Vitals and nursing note reviewed.  Constitutional:      General: He is not in acute distress.    Appearance: Normal appearance. He is not ill-appearing.  HENT:     Head: Normocephalic and atraumatic.  Eyes:     General: No scleral icterus.       Right eye: No discharge.        Left eye: No discharge.     Conjunctiva/sclera: Conjunctivae normal.     Comments: Left pupil slightly dilated, no significant injection sclera without icterus   IOP: L eye 10, 12, 12 IOP: R eye 10, 16  Pulmonary:     Effort: Pulmonary effort is normal.     Breath sounds: No  stridor.  Skin:    General: Skin is warm and dry.  Neurological:     Mental Status: He is alert and oriented to person, place, and time. Mental status is at baseline.     (all labs ordered are listed, but only abnormal results are displayed) Labs Reviewed - No data to display  EKG: None  Radiology: No results found.   Procedures   Medications Ordered in the ED  tetracaine (PONTOCAINE) 0.5 % ophthalmic solution 2 drop (has no administration in time range)    Clinical Course as of 11/21/23 2245  Wed Nov 21, 2023  1654 L eye 10, 12, 12  R eye nml [WF]  1704 [phone] - Dr. Vann   Valtrex 1g TID   [WF]  1811 Todd Winters -will talk with Todd Winters who is patient's significant other and see if they can arrange outpatient care [WF]    Clinical Course User Index [WF] Neldon Hamp RAMAN, PA                                 Medical Decision Making Risk Prescription drug management.   Pt complains of pain in the left eye and blurry vision.  He was sent in by his ophthalmologist for admission for medication management because it seems that patient is having poor compliance with his eyedrops at home.  He is coming from Dr. Craige office.  Dr. Fleeta is on-call for this office.  They have paperwork with them that communicates this plan. 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   It seems that in the office today patient's left eye at a pressure of 44 and right eye had a pressure of 18.  He was sent to the emergency department because of medication noncompliance due to memory issues which seem to be chronic and unchanged in this patient's life.  He is with his wife who indicates to me that she has been trying to stay on top of giving him his eyedrops but that she struggles to do so because she is  dealing with a pregnant daughter and a son who has Marfan syndrome.   Patient's left eye with normal pressures. Patient has had elevated intraocular pressure for the past week since his traumatic eye injury.  I do lengthy discussion with this patient's wife  Todd Winters who agrees to take him home.  I did talk to transition of care team Todd who offered health aide at home but this was declined.  I talked with Dr. Fleeta of ophthalmology who confirmed that this was a request for admission for medication administration.  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  I discussed this with patient's wife who indicates that he is taking chronic suppressive therapy in the form of acyclovir .  She declines additional antivirals.  Patient's wife states that she would prefer to take patient home.  She feels that she can figure it out will discharge home at this time.   Final diagnoses:  None    ED Discharge Orders     None          Neldon Hamp RAMAN, [place] 11/21/23 2321    Freddi Hamilton, MD 11/29/23 [phone]

## 2023-11-21 NOTE — ED Notes (Signed)
 ED Provider at bedside.

## 2023-11-21 NOTE — ED Triage Notes (Signed)
 Pt sent from eye doctor for pressure in his left eye, pressure increasing and swelling increasing. Pressure drops, antibiotics and steroid for over a week with no improvement.
